# Patient Record
Sex: Female | Born: 1980 | Race: White | Hispanic: No | Marital: Single | Smoking: Never smoker
Health system: Southern US, Community
[De-identification: ages and names within clinical notes are randomized; demographics above are authoritative.]

## PROBLEM LIST (undated history)

## (undated) DIAGNOSIS — K602 Anal fissure, unspecified: Secondary | ICD-10-CM

## (undated) DIAGNOSIS — E079 Disorder of thyroid, unspecified: Secondary | ICD-10-CM

## (undated) DIAGNOSIS — J302 Other seasonal allergic rhinitis: Secondary | ICD-10-CM

## (undated) HISTORY — PX: CHOLECYSTECTOMY: SHX55

---

## 2019-05-28 ENCOUNTER — Emergency Department (HOSPITAL_COMMUNITY): Payer: Self-pay

## 2019-05-28 ENCOUNTER — Other Ambulatory Visit: Payer: Self-pay

## 2019-05-28 ENCOUNTER — Emergency Department (HOSPITAL_COMMUNITY)
Admission: EM | Admit: 2019-05-28 | Discharge: 2019-05-28 | Disposition: A | Discharge status: Home / Self Care | Payer: Self-pay | Attending: Emergency Medicine | Admitting: Emergency Medicine

## 2019-05-28 DIAGNOSIS — D259 Leiomyoma of uterus, unspecified: Secondary | ICD-10-CM | POA: Insufficient documentation

## 2019-05-28 DIAGNOSIS — N281 Cyst of kidney, acquired: Secondary | ICD-10-CM | POA: Insufficient documentation

## 2019-05-28 DIAGNOSIS — K529 Noninfective gastroenteritis and colitis, unspecified: Secondary | ICD-10-CM | POA: Insufficient documentation

## 2019-05-28 DIAGNOSIS — R1032 Left lower quadrant pain: Secondary | ICD-10-CM

## 2019-05-28 DIAGNOSIS — Q631 Lobulated, fused and horseshoe kidney: Secondary | ICD-10-CM | POA: Insufficient documentation

## 2019-05-28 DIAGNOSIS — Z79899 Other long term (current) drug therapy: Secondary | ICD-10-CM | POA: Insufficient documentation

## 2019-05-28 LAB — URINALYSIS, ROUTINE W REFLEX MICROSCOPIC
Bilirubin Urine: NEGATIVE
Glucose, UA: NEGATIVE mg/dL
Hgb urine dipstick: NEGATIVE
Ketones, ur: NEGATIVE mg/dL
Leukocytes,Ua: NEGATIVE
Nitrite: NEGATIVE
Protein, ur: NEGATIVE mg/dL
Specific Gravity, Urine: 1.016 (ref 1.005–1.030)
pH: 6 (ref 5.0–8.0)

## 2019-05-28 LAB — CBC
HCT: 39 % (ref 36.0–46.0)
Hemoglobin: 12 g/dL (ref 12.0–15.0)
MCH: 26.8 pg (ref 26.0–34.0)
MCHC: 30.8 g/dL (ref 30.0–36.0)
MCV: 87.2 fL (ref 80.0–100.0)
Platelets: 347 10*3/uL (ref 150–400)
RBC: 4.47 MIL/uL (ref 3.87–5.11)
RDW: 15.6 % — ABNORMAL HIGH (ref 11.5–15.5)
WBC: 8.1 10*3/uL (ref 4.0–10.5)
nRBC: 0 % (ref 0.0–0.2)

## 2019-05-28 LAB — COMPREHENSIVE METABOLIC PANEL
ALT: 27 U/L (ref 0–44)
AST: 24 U/L (ref 15–41)
Albumin: 3.5 g/dL (ref 3.5–5.0)
Alkaline Phosphatase: 83 U/L (ref 38–126)
Anion gap: 9 (ref 5–15)
BUN: 13 mg/dL (ref 6–20)
CO2: 22 mmol/L (ref 22–32)
Calcium: 8.2 mg/dL — ABNORMAL LOW (ref 8.9–10.3)
Chloride: 105 mmol/L (ref 98–111)
Creatinine, Ser: 0.87 mg/dL (ref 0.44–1.00)
GFR calc Af Amer: >60 mL/min (ref >60)
GFR calc non Af Amer: >60 mL/min (ref >60)
Glucose, Bld: 101 mg/dL — ABNORMAL HIGH (ref 70–99)
Potassium: 3.6 mmol/L (ref 3.5–5.1)
Sodium: 136 mmol/L (ref 135–145)
Total Bilirubin: 0.5 mg/dL (ref 0.3–1.2)
Total Protein: 6.7 g/dL (ref 6.5–8.1)

## 2019-05-28 LAB — I-STAT BETA HCG BLOOD, ED (MC, WL, AP ONLY): I-stat hCG, quantitative: <5 m[IU]/mL (ref <5)

## 2019-05-28 LAB — LIPASE, BLOOD: Lipase: 20 U/L (ref 11–51)

## 2019-05-28 MED ORDER — ACETAMINOPHEN 325 MG PO TABS
650.0000 mg | ORAL_TABLET | Freq: Once | ORAL | Status: AC
  Administered 2019-05-28: 11:00:00 650 mg via ORAL
  Filled 2019-05-28: qty 2

## 2019-05-28 MED ORDER — ONDANSETRON HCL 4 MG/2ML IJ SOLN
4.0000 mg | Freq: Once | INTRAMUSCULAR | Status: DC
  Filled 2019-05-28: qty 2

## 2019-05-28 MED ORDER — ONDANSETRON 4 MG PO TBDP
4.0000 mg | ORAL_TABLET | Freq: Once | ORAL | Status: AC
  Administered 2019-05-28: 4 mg via ORAL
  Filled 2019-05-28: qty 1

## 2019-05-28 MED ORDER — HYDROCODONE-ACETAMINOPHEN 5-325 MG PO TABS: 1.0000 | ORAL_TABLET | Freq: Four times a day (QID) | ORAL | 0 refills | Status: DC | PRN

## 2019-05-28 NOTE — Discharge Instructions (Addendum)
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  At this time there does not appear to be the presence of an emergent medical condition, however there is always the potential for conditions to change. Please read and follow the below instructions.  Please return to the Emergency Department immediately for any new or worsening symptoms. Please be sure to follow up with your Primary Care Provider within one week regarding your visit today; please call their office to schedule an appointment even if you are feeling better for a follow-up visit. CT scan today showed inflammation of your mid abdominal small bowel.  You have been referred to [facility] gastroenterology today. Please call their office today to schedule a follow-up appointment for further evaluation of this inflammation as investigation with further lab work and possible colonoscopy/endoscopy may be indicated.  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  Please discuss these findings with your primary care provider at your next visit. Additionally a uterine fibroid was seen on your ultrasound today, I discussed this with your primary care provider and your OB/GYN at your next visit. Please drink plenty of water and get plenty of rest to help with your symptoms.  You may use the pain medication Norco as prescribed for severe pain.  Do not use Norco with other sedating medications such as Valium as this will worsen side effects.  Do not drink alcohol with Norco.  Do not drive or perform any dangerous activities while taking Norco as it will make you drowsy.  Get help right away if: Your pain does not go away as soon as your doctor says it should. You cannot stop throwing up. Your pain is only in areas of your belly, such as the right side or the left lower part of the belly. You have bloody or black poop, or poop that looks like tar. You have  very bad pain, cramping, or bloating in your belly. You have signs of not having enough fluid or water in your body (dehydration), such as: Dark pee, very little pee, or no pee. Cracked lips. Dry mouth. Sunken eyes. Sleepiness. Weakness. You have any new/concerning or worsening of symptoms  Please read the additional information packets attached to your discharge summary.  Do not take your medicine if  develop an itchy rash, swelling in your mouth or lips, or difficulty breathing; call 911 and seek immediate emergency medical attention if this occurs.  Note: Portions of this text may have been transcribed using voice recognition software. Every effort was made to ensure accuracy; however, inadvertent computerized transcription errors may still be present.

## 2019-05-28 NOTE — ED Notes (Signed)
Patient verbalizes understanding of discharge instructions. Opportunity for questioning and answers were provided. Armband removed by staff, pt discharged from ED to home 

## 2019-05-28 NOTE — ED Provider Notes (Signed)
[place] EMERGENCY DEPARTMENT Provider Note   CSN: [id number] Arrival date & time: 05/28/19  [id number]     History Chief Complaint  Patient presents with   Abdominal Pain    Sharon Pruitt is a 38 y.o. female presents today for abdominal pain and multiple bowel movements.  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  She reports that last night she developed a left lower abdominal cramping pain which has been waxing and waning since that time and moderate-severe in intensity and radiating to her mid abdomen, she reports this pain was accompanied with approximately 10 bowel movements in the last 12 hours she denies any diarrhea reports each of her bowel movements were solid in consistency.  Patient became concerned when her pain did not stop after she had multiple bowel movements as is her normal.  Patient reports some nausea with pain.  Denies fever/chills, headache, neck pain, chest pain/shortness of breath, cough/hemoptysis, emesis, diarrhea, melena, hematochezia, dysuria/hematuria, vaginal bleeding/discharge, concern for STI, numbness/weakness, tingling or any additional concerns. HPI     No past medical history on file.  There are no problems to display for this patient.      OB History   No obstetric history on file.     No family history on file.  Social History   Tobacco Use   Smoking status: Not on file  Substance Use Topics   Alcohol use: Not on file   Drug use: Not on file    Home Medications Prior to Admission medications   Medication Sig Start Date End Date Taking? Authorizing Provider  diazepam (VALIUM) 5 MG tablet Take 5 mg by mouth daily as needed for anxiety.   Yes [provider]  omeprazole (PRILOSEC) 20 MG capsule Take 20 mg by mouth daily. 09/01/18  Yes [provider]  ondansetron (ZOFRAN-ODT) 4 MG disintegrating tablet Take 4 mg by mouth  every 12 (twelve) hours as needed for nausea. 09/01/18  Yes [provider]  HYDROcodone-acetaminophen (NORCO/VICODIN) 5-325 MG tablet Take 1 tablet by mouth every 6 (six) hours as needed. 05/28/19   Deliah Boston, PA-C  levothyroxine (SYNTHROID) 25 MCG tablet Take 25 mcg by mouth daily. 04/02/19   [provider]    Allergies    Contrast media [iodinated diagnostic agents], Penicillins, and Lactase  Review of Systems   Review of Systems Ten systems are reviewed and are negative for acute change except as noted in the HPI  Physical Exam Updated Vital Signs BP 105/79 (BP Location: Right Arm)    Pulse 78    Temp 98.3 F (36.8 C) (Oral)    Resp 20    LMP 05/02/2019    SpO2 97%   Physical Exam Constitutional:      General: She is not in acute distress.    Appearance: Normal appearance. She is well-developed. She is obese. She is not ill-appearing or diaphoretic.  HENT:     Head: Normocephalic and atraumatic.     Right Ear: External ear normal.     Left Ear: External ear normal.     Nose: Nose normal.  Eyes:     General: Vision grossly intact. Gaze aligned appropriately.     Pupils: Pupils are equal, round, and reactive to light.  Neck:     Trachea: Trachea and phonation normal. No tracheal deviation.  Pulmonary:     Effort: Pulmonary effort is normal. No respiratory distress.  Abdominal:     General: There is no distension.     Palpations: Abdomen is soft.     Tenderness: There is generalized abdominal tenderness. There is no guarding or rebound.  Genitourinary:    Comments: Pelvic exam deferred by patient Musculoskeletal:        General: Normal range of motion.     Cervical back: Normal range of motion.  Skin:    General: Skin is warm and dry.  Neurological:     Mental Status: She is alert.     GCS: GCS eye subscore is 4. GCS verbal subscore is 5. GCS motor subscore is 6.     Comments: Speech is clear and goal oriented, follows commands Major Cranial  nerves without deficit, no facial droop Moves extremities without ataxia, coordination intact  Psychiatric:        Behavior: Behavior normal.     ED Results / Procedures / Treatments   Labs (all labs ordered are listed, but only abnormal results are displayed) Labs Reviewed  COMPREHENSIVE METABOLIC PANEL - Abnormal; Notable for the following components:      Result Value   Glucose, Bld 101 (*)    Calcium 8.2 (*)    All other components within normal limits  CBC - Abnormal; Notable for the following components:   RDW 15.6 (*)    All other components within normal limits  URINALYSIS, ROUTINE W REFLEX MICROSCOPIC - Abnormal; Notable for the following components:   APPearance HAZY (*)    All other components within normal limits  LIPASE, BLOOD  I-STAT BETA HCG BLOOD, ED ([facility], [facility], [facility] ONLY)    EKG None  Radiology CT ABDOMEN PELVIS WO CONTRAST  Result Date: 05/28/2019 CLINICAL DATA:  38 year old female with left side back and abdominal pain. Frequent bowel movements. Possible diverticulitis. EXAM: CT ABDOMEN AND PELVIS WITHOUT CONTRAST TECHNIQUE: Multidetector CT imaging of the abdomen and pelvis was performed following the standard protocol without IV contrast. COMPARISON:  None. FINDINGS: Lower chest: Negative. Hepatobiliary: Surgically absent gallbladder. Negative noncontrast liver. Pancreas: Negative. Spleen: Negative. Adrenals/Urinary Tract: Normal adrenal glands. 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 There are at least 2 right side renal arteries identified. But in the left renal fossa there is a multi cystic lobulated lesion which is connected by a thin band of tissue to the right side renal parenchyma. See coronal images 52 through 62. Multiple small cystic components to almost 3 centimeters range from subcentimeter and have simple fluid density. There is no associated perinephric  stranding or urinary calculus. A right side ureter takes off anteriorly from the renal complex and appears decompressed to the bladder. It is unclear whether there might be a vestigial left side ureter. The urinary bladder is decompressed and unremarkable. Stomach/Bowel: Negative rectum aside from retained stool. Redundant sigmoid colon, but no diverticulosis. Negative descending colon and transverse colon. Mildly redundant hepatic flexure. Negative right colon, with no large bowel inflammation identified. Decompressed and negative terminal ileum. Normal appendix visible on coronal image 45. There is an abnormally dilated loop of small bowel in the mid abdomen containing flocculated stool type material on series 3, image 52 and coronal images 24 through 30. This loop measures about 40 millimeters diameter with regional smaller but similar featureless appearing small bowel loops. There is mesenteric stranding associated with the dominant loop (coronal image 28), but wall thickening without mesenteric stranding elsewhere (coronal image 43). There is a  gradual transition both upstream and downstream of these abnormal loops which seem to be distal jejunum. The distal small bowel appears negative. Decompressed and negative stomach, duodenum, and proximal jejunum. No free air or free fluid identified. Vascular/Lymphatic: Vascular patency is not evaluated in the absence of IV contrast. No calcified atherosclerosis. Reproductive: Negative noncontrast appearance. Other: Trace free fluid in the cul-de-sac. There do appear to be chronic postoperative changes to the ventral lower abdominal wall, including somewhat stellate granulation tissue suspected on series 3, image 72. But no regional inflammation. Musculoskeletal: No acute osseous abnormality identified. 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 IMPRESSION: 1. The symptomatic abnormality is favored to be abnormal mid abdominal small  bowel which appears mildly inflamed, dilated up to 4 cm, and contains flocculated material. There is a gradual transition from abnormal mid small bowel loops to normal proximal and distal small bowel. And no other bowel inflammation identified. Differential considerations include acute infectious enteritis, focal inflammatory bowel disease (although the terminal ileum is normal), and internal hernia (see #3). 2. Normal appendix. Negative large bowel. No free air or free fluid. 3. Unusual renal malformation which could reflect cross-fused right side renal ectopia and/or atrophy of the left renal component of a horseshoe kidney with compensatory hypertrophy on the right. Small multiloculated benign-appearing cystic lesions occupy the left renal fossa and appear connected by a thin band of tissue to the right side renal complex. No urinary calculus, obstructive uropathy, or inflammation is evident. 4. Trace pelvic free fluid, likely physiologic. Electronically Signed   By: Genevie Ann M.D.   On: 05/28/2019 07:59   US PELVIC COMPLETE W TRANSVAGINAL AND TORSION R/O  Result Date: 05/28/2019 CLINICAL DATA:  Intermittent left lower quadrant pain with recent worsening EXAM: TRANSABDOMINAL AND TRANSVAGINAL ULTRASOUND OF PELVIS DOPPLER ULTRASOUND OF OVARIES TECHNIQUE: Both transabdominal and transvaginal ultrasound examinations of the pelvis were performed. Transabdominal technique was performed for global imaging of the pelvis including uterus, ovaries, adnexal regions, and pelvic cul-de-sac. It was necessary to proceed with endovaginal exam following the transabdominal exam to visualize the uterus, endometrium and right ovary. Color and duplex Doppler ultrasound was utilized to evaluate blood flow to the ovaries. COMPARISON:  None. FINDINGS: Uterus Measurements: 9.8 x 5 x 6.5 cm = volume: 168.5 mL. There is a 4.1 x 3.6 x 4.9 cm fibroid at the anterior fundus. Endometrium Thickness: 10 mm.  No focal abnormality visualized.  Right ovary Measurements: 3 x 2.1 x 1.8 cm = volume: 5.8 mL. Normal appearance/no adnexal mass. Left ovary Measurements: Size in 3 dimensions = volume: 6.8 mL. Normal appearance/no adnexal mass. Pulsed Doppler evaluation of both ovaries demonstrates normal low-resistance arterial and venous waveforms. Other findings Trace free fluid, likely physiologic. IMPRESSION: No evidence of ovarian torsion. Uterine fibroid. Electronically Signed   By: Macy Mis M.D.   On: 05/28/2019 11:04    Procedures Procedures (including critical care time)  Medications Ordered in ED Medications  ondansetron (ZOFRAN-ODT) disintegrating tablet 4 mg (4 mg Oral Given 05/28/19 0806)  acetaminophen (TYLENOL) tablet 650 mg (650 mg Oral Given 05/28/19 1043)    ED Course  I have reviewed the triage vital signs and the nursing notes.  Pertinent labs & imaging results that were available during my care of the patient were reviewed by me and considered in my medical decision making (see chart for details).    MDM Rules/Calculators/A&P  Beta-hCG negative CBC nonacute  CMP nonacute Lipase within normal limits Urinalysis nonacute - On initial evaluation patient well-appearing and in no acute distress, cranial nerves intact, no meningeal signs, heart regular rate and rhythm, lungs clear, abdomen generally tender without guarding or rebound, neurovascular tact to all 4 extremities without evidence of DVT.  She has asked not to receive any pain medication here in the ED.  There is concern for possible diverticulitis will obtain CT scan, patient has contrast allergy will have to obtain scan noncontrasted.  Additionally low suspicion for GYN etiology of patient's symptoms today, she denies any pelvic pain, vaginal bleeding or discharge, she denies concern for STI and refuses pelvic exam. - CT [facility]:  IMPRESSION:  1. The symptomatic abnormality is favored to be abnormal mid  abdominal small bowel which  appears mildly inflamed, dilated up to 4  cm, and contains flocculated material.  There is a gradual transition from abnormal mid small bowel loops to  normal proximal and distal small bowel. And no other bowel  inflammation identified.  Differential considerations include acute infectious enteritis,  focal inflammatory bowel disease (although the terminal ileum is  normal), and internal hernia (see #3).    2. Normal appendix. Negative large bowel. No free air or free fluid.    3. Unusual renal malformation which could reflect cross-fused right  side renal ectopia and/or atrophy of the left renal component of a  horseshoe kidney with compensatory hypertrophy on the right.  Small multiloculated benign-appearing cystic lesions occupy the left  renal fossa and appear connected by a thin band of tissue to the  right side renal complex.  No urinary calculus, obstructive uropathy, or inflammation is  evident.    4. Trace pelvic free fluid, likely physiologic.     - 8:35 AM: Patient updated on findings as above and states understanding.  She is requesting GI referral here in [place] which will be provided at discharge.  I have discussed pelvic exam with the patient, she refuses pelvic examination today but requests ultrasound be performed for evaluation of her ovary.  She denies concern for STI stating she is in a stable relationship, feel it is reasonable at this time to proceed with ultrasound examination for evaluation of possible cyst versus torsion as etiology of her left lower quadrant pain that is not completely explained with mid small bowel inflammation. - Pelvis US:  IMPRESSION:  No evidence of ovarian torsion.    Uterine fibroid.    - Patient updated on findings as above and states understanding.  She has been given GI referral for further evaluation of bowel inflammation.  As she is without infectious-like symptoms, afebrile and normal vital signs I have low suspicion  for this to be infectious in etiology, no indictation at this time for antibiotic treatment.  Patient plans to call GI today to schedule a follow-up appointment for further evaluation.  Additionally patient's left lower quadrant pain may be referred from the inflammation of her os, there is no evidence of torsion on ultrasound, she refused pelvic examination twice today feel this is reasonable as she is without vaginal discharge and has no concern for STI, I have low suspicion for PID at this time.  I encouraged her to call her primary care doctor and OB/GYN to schedule follow-up appointment and pelvic examination.  PMDP reviewed patient's last narcotic prescription was 01/27/2019 for 15 pills of Norco.  Feel it is reasonable at this time to provide patient with a prescription for 3  pills of Norco at this time for pain due to her bowel inflammation, I discussed narcotic precautions with patient and she states understanding, additionally advised her not to take Valium on the same days that she takes Norco to avoid sedation.  At this time there does not appear to be any evidence of an acute emergency medical condition and the patient appears stable for discharge with appropriate outpatient follow up. Diagnosis was discussed with patient who verbalizes understanding of care plan and is agreeable to discharge. I have discussed return precautions with patient who verbalizes understanding of return precautions. Patient encouraged to follow-up with their PCP and GI. All questions answered.  Patient's case discussed with Dr. Theora Gianotti who agrees with plan to discharge with GI follow-up.   Note: Portions of this report may have been transcribed using voice recognition software. Every effort was made to ensure accuracy; however, inadvertent computerized transcription errors may still be present. Final Clinical Impression(s) / ED Diagnoses Final diagnoses:  Bowel disease, inflammatory  Kidney cysts  Horseshoe kidney    Uterine leiomyoma, unspecified location    Rx / DC Orders ED Discharge Orders         Ordered    HYDROcodone-acetaminophen (NORCO/VICODIN) 5-325 MG tablet  Every 6 hours PRN     05/28/19 1144           Gari Crown 05/28/19 1158    Blanchie Dessert, MD 05/29/19 2346

## 2019-05-28 NOTE — ED Notes (Signed)
Patient transported to CT 

## 2019-05-28 NOTE — ED Triage Notes (Signed)
Pt states abd pain with frequent bowel movements since yesterday. Denies blood in bowel movements.

## 2020-01-30 ENCOUNTER — Encounter (HOSPITAL_COMMUNITY): Payer: Self-pay | Admitting: Emergency Medicine

## 2020-01-30 ENCOUNTER — Emergency Department (HOSPITAL_COMMUNITY)
Admission: EM | Admit: 2020-01-30 | Discharge: 2020-01-31 | Disposition: A | Discharge status: Home / Self Care | Payer: Self-pay | Attending: Emergency Medicine | Admitting: Emergency Medicine

## 2020-01-30 DIAGNOSIS — Z5321 Procedure and treatment not carried out due to patient leaving prior to being seen by health care provider: Secondary | ICD-10-CM | POA: Insufficient documentation

## 2020-01-30 DIAGNOSIS — K625 Hemorrhage of anus and rectum: Secondary | ICD-10-CM | POA: Insufficient documentation

## 2020-01-30 NOTE — ED Triage Notes (Signed)
Patient reports she noticed blood while wiping earlier this evening. Also reports rectal discomfort with sitting. Reports she may have hemorrhoids.

## 2020-01-31 NOTE — ED Notes (Signed)
Patient called x2 with no response for vitals recheck

## 2020-02-20 ENCOUNTER — Other Ambulatory Visit: Payer: Self-pay

## 2020-02-20 ENCOUNTER — Emergency Department (HOSPITAL_COMMUNITY)
Admission: EM | Admit: 2020-02-20 | Discharge: 2020-02-20 | Disposition: A | Discharge status: Home / Self Care | Payer: BC Managed Care – PPO | Attending: Emergency Medicine | Admitting: Emergency Medicine

## 2020-02-20 ENCOUNTER — Ambulatory Visit (HOSPITAL_COMMUNITY)
Admission: EM | Admit: 2020-02-20 | Discharge: 2020-02-20 | Disposition: A | Discharge status: Home / Self Care | Payer: BC Managed Care – PPO | Attending: Family Medicine | Admitting: Family Medicine

## 2020-02-20 ENCOUNTER — Encounter (HOSPITAL_COMMUNITY): Payer: Self-pay | Admitting: Emergency Medicine

## 2020-02-20 ENCOUNTER — Encounter (HOSPITAL_COMMUNITY): Payer: Self-pay

## 2020-02-20 DIAGNOSIS — R110 Nausea: Secondary | ICD-10-CM | POA: Diagnosis not present

## 2020-02-20 DIAGNOSIS — K625 Hemorrhage of anus and rectum: Secondary | ICD-10-CM | POA: Diagnosis present

## 2020-02-20 DIAGNOSIS — M5442 Lumbago with sciatica, left side: Secondary | ICD-10-CM

## 2020-02-20 DIAGNOSIS — L0231 Cutaneous abscess of buttock: Secondary | ICD-10-CM | POA: Diagnosis not present

## 2020-02-20 DIAGNOSIS — R519 Headache, unspecified: Secondary | ICD-10-CM | POA: Insufficient documentation

## 2020-02-20 DIAGNOSIS — Z5321 Procedure and treatment not carried out due to patient leaving prior to being seen by health care provider: Secondary | ICD-10-CM | POA: Insufficient documentation

## 2020-02-20 LAB — CBC
HCT: 36 % (ref 36.0–46.0)
Hemoglobin: 11.6 g/dL — ABNORMAL LOW (ref 12.0–15.0)
MCH: 29.6 pg (ref 26.0–34.0)
MCHC: 32.2 g/dL (ref 30.0–36.0)
MCV: 91.8 fL (ref 80.0–100.0)
Platelets: 373 10*3/uL (ref 150–400)
RBC: 3.92 MIL/uL (ref 3.87–5.11)
RDW: 13.7 % (ref 11.5–15.5)
WBC: 8 10*3/uL (ref 4.0–10.5)
nRBC: 0 % (ref 0.0–0.2)

## 2020-02-20 LAB — COMPREHENSIVE METABOLIC PANEL
ALT: 30 U/L (ref 0–44)
AST: 22 U/L (ref 15–41)
Albumin: 3.2 g/dL — ABNORMAL LOW (ref 3.5–5.0)
Alkaline Phosphatase: 90 U/L (ref 38–126)
Anion gap: 10 (ref 5–15)
BUN: 9 mg/dL (ref 6–20)
CO2: 22 mmol/L (ref 22–32)
Calcium: 8.5 mg/dL — ABNORMAL LOW (ref 8.9–10.3)
Chloride: 106 mmol/L (ref 98–111)
Creatinine, Ser: 1.02 mg/dL — ABNORMAL HIGH (ref 0.44–1.00)
GFR calc Af Amer: >60 mL/min (ref >60)
GFR calc non Af Amer: >60 mL/min (ref >60)
Glucose, Bld: 117 mg/dL — ABNORMAL HIGH (ref 70–99)
Potassium: 3.5 mmol/L (ref 3.5–5.1)
Sodium: 138 mmol/L (ref 135–145)
Total Bilirubin: <0.1 mg/dL — ABNORMAL LOW (ref 0.3–1.2)
Total Protein: 6.7 g/dL (ref 6.5–8.1)

## 2020-02-20 LAB — TYPE AND SCREEN
ABO/RH(D): O POS
Antibody Screen: NEGATIVE

## 2020-02-20 LAB — I-STAT BETA HCG BLOOD, ED (MC, WL, AP ONLY): I-stat hCG, quantitative: <5 m[IU]/mL (ref <5)

## 2020-02-20 MED ORDER — METRONIDAZOLE 0.75 % EX GEL: 1.0000 "application " | Freq: Two times a day (BID) | CUTANEOUS | 0 refills | Status: AC

## 2020-02-20 MED ORDER — CYCLOBENZAPRINE HCL 5 MG PO TABS: 5.0000 mg | ORAL_TABLET | Freq: Three times a day (TID) | ORAL | 0 refills | Status: DC | PRN

## 2020-02-20 NOTE — ED Provider Notes (Signed)
[facility]    CSN: [id number] Arrival date & time: 02/20/20  1403      History   Chief Complaint Chief Complaint  Patient presents with   Rectal Bleeding    HPI Sharon Pruitt is a 39 y.o. female.   Started having rectal bleeding and left low back and buttock pain the past few days after lifting something very heavy at work. 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 Has been having rectal pain, discharge and loose stools recently which started during prednisone and 2 zpaks. PCP sent her to GI for suspected internal hemorrhoids, has that appt on Wednesday. Denies fever, chills, body aches, abdominal pain, N/V. Has been doing sitz baths and using witch hazel wipes without much relief. Tylenol does seem to help the pain some.      History reviewed. No pertinent past medical history.  There are no problems to display for this patient.   History reviewed. No pertinent surgical history.  OB History   No obstetric history on file.      Home Medications    Prior to Admission medications   Medication Sig Start Date End Date Taking? Authorizing Provider  cyclobenzaprine (FLEXERIL) 5 MG tablet Take 1 tablet (5 mg total) by mouth 3 (three) times daily as needed for muscle spasms. DO NOT DRIVE OR DRINK ALCOHOL WHILE TAKING THIS MEDICATION 02/20/20   Volney American, PA-C  diazepam (VALIUM) 5 MG tablet Take 5 mg by mouth daily as needed for anxiety.    [provider]  HYDROcodone-acetaminophen (NORCO/VICODIN) 5-325 MG tablet Take 1 tablet by mouth every 6 (six) hours as needed. 05/28/19   Deliah Boston, PA-C  levothyroxine (SYNTHROID) 25 MCG tablet Take 25 mcg by mouth daily. 04/02/19   [provider]  metroNIDAZOLE (METROGEL) 0.75 % gel Apply 1 application topically 2 (two) times daily. 02/20/20   Volney American, PA-C  omeprazole (PRILOSEC) 20 MG capsule Take 20 mg by mouth daily. 09/01/18   [provider]  ondansetron (ZOFRAN-ODT) 4 MG disintegrating tablet Take 4 mg by mouth every 12 (twelve) hours as needed for nausea. 09/01/18   [provider]    Family History History reviewed. No pertinent family history.  Social History Social History   Tobacco Use   Smoking status: Never Smoker   Smokeless tobacco: Never Used  Substance Use Topics   Alcohol use: Yes    Comment: Socially    Drug use: Not Currently     Allergies   Iodinated diagnostic agents, Penicillins, Sulfa antibiotics, and Lactase   Review of Systems Review of Systems PER HPI   Physical Exam Triage Vital Signs ED Triage Vitals  Enc Vitals Group     BP 02/20/20 1552 (!) 107/59     Pulse Rate 02/20/20 1552 (!) 109     Resp 02/20/20 1552 20     Temp 02/20/20 1552 98.3 F (36.8 C)     Temp Source 02/20/20 1552 Oral     SpO2 02/20/20 1552 100 %     Weight --      Height --      Head Circumference --      Peak Flow --      Pain Score 02/20/20 1550 0     Pain Loc --      Pain Edu? --      Excl. in [place]? --    No data found.  Updated Vital  Signs BP (!) 107/59 (BP Location: Right Arm)    Pulse (!) 109    Temp 98.3 F (36.8 C) (Oral)    Resp 20    LMP 02/13/2020    SpO2 100%   Visual Acuity Right Eye Distance:   Left Eye Distance:   Bilateral Distance:    Right Eye Near:   Left Eye Near:    Bilateral Near:     Physical Exam Vitals and nursing note reviewed.  Constitutional:      Appearance: Normal appearance. She is not ill-appearing.  HENT:     Head: Atraumatic.  Eyes:     Extraocular Movements: Extraocular movements intact.     Conjunctiva/sclera: Conjunctivae normal.  Cardiovascular:     Rate and Rhythm: Normal rate and regular rhythm.     Pulses: Normal pulses.     Heart sounds: Normal heart sounds.  Pulmonary:     Effort: Pulmonary effort is normal.     Breath sounds: Normal breath sounds. No wheezing or rales.  Abdominal:     General: Bowel sounds are normal. There is  no distension.     Palpations: Abdomen is soft.     Tenderness: There is no abdominal tenderness. There is no guarding.  Genitourinary:   Musculoskeletal:        General: Tenderness (ttp over left low back down into lateral hip) present. Normal range of motion.     Cervical back: Normal range of motion and neck supple.     Comments: - SLR b/l LEs  Skin:    General: Skin is warm and dry.  Neurological:     Mental Status: She is alert and oriented to person, place, and time.     Sensory: No sensory deficit.  Psychiatric:        Mood and Affect: Mood normal.        Thought Content: Thought content normal.        Judgment: Judgment normal.      UC Treatments / Results  Labs (all labs ordered are listed, but only abnormal results are displayed) Labs Reviewed - No data to display  EKG   Radiology No results found.  Procedures Procedures (including critical care time)  Medications Ordered in UC Medications - No data to display  Initial Impression / Assessment and Plan / UC Course  I have reviewed the triage vital signs and the nursing notes.  Pertinent labs & imaging results that were available during my care of the patient were reviewed by me and considered in my medical decision making (see chart for details).     Patient states she just received a large bottle of doxycycline from her PCP for an ear infection but hadn't started it yet - discussed to begin taking to help with her abscess and will also rx metrogel for increased coverage given location. She declines I and D of the abscess today. Discussed warm sitz baths, close f/u with GI specialist for this and her suspected internal hemorrhoids on Wednesday.  Rx also sent for flexeril for suspected piriformis syndrome on the left. Light duty note for work with lifting restrictions given as I feel this is exacerbating her hemorrhoids as well as muscular pain.  Pt agreeable to plan and instructed to f/u if sxs worsening at  any time. F/u with specialists and PCP as scheduled  Final Clinical Impressions(s) / UC Diagnoses   Final diagnoses:  Rectal bleeding  Gluteal abscess  Acute left-sided low back pain with left-sided sciatica  Discharge Instructions     Take 1 week of twice daily doxycycline and apply the metronidazole gel topically to the area of the abscess twice daily. Warm sitz baths, rest, avoid heavy lifting    ED Prescriptions    Medication Sig Dispense Auth. Provider   metroNIDAZOLE (METROGEL) 0.75 % gel Apply 1 application topically 2 (two) times daily. 45 g Volney American, [place]   cyclobenzaprine (FLEXERIL) 5 MG tablet Take 1 tablet (5 mg total) by mouth 3 (three) times daily as needed for muscle spasms. DO NOT DRIVE OR DRINK ALCOHOL WHILE TAKING THIS MEDICATION 30 tablet Volney American, [place]     PDMP not reviewed this encounter.   Volney American, [place] 02/20/20 1948

## 2020-02-20 NOTE — ED Triage Notes (Signed)
Pt reports bright red rectal bleeding x 3-4 days that started after picking her son up.  States blood doesn't appear to be in her stool.  Pain to L buttocks.  Also reports headache and nausea.

## 2020-02-20 NOTE — Discharge Instructions (Signed)
Take 1 week of twice daily doxycycline and apply the metronidazole gel topically to the area of the abscess twice daily. Warm sitz baths, rest, avoid heavy lifting

## 2020-02-20 NOTE — ED Triage Notes (Signed)
Pt presents with rectal pain and let buttock tingling x 3-4 days. Pt reports having bright red rectal bleeding.

## 2020-08-06 IMAGING — CT CT ABD-PELV W/O CM
2 of 4 series · 14 of 46 positions shown, 16 images · non-contrast
Comparison: None.

CLINICAL DATA: 38-year-old female with left side back and abdominal
pain. Frequent bowel movements. Possible diverticulitis.

EXAM:
CT ABDOMEN AND PELVIS WITHOUT CONTRAST
TECHNIQUE: Multidetector CT imaging of the abdomen and pelvis was performed
following the standard protocol without IV contrast.

[Series 3: ap without · axial · non-contrast · 0.85mm/px · z∈[+892,+1307]mm · 11 of 95 slices shown, 13 images]
[im 6/95  soft-tissue]
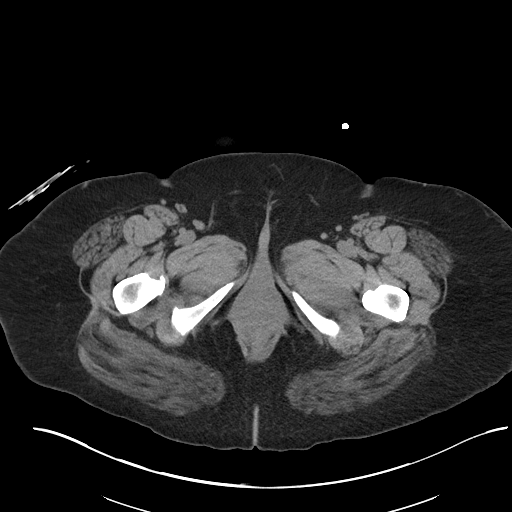
[im 6/95  bone]
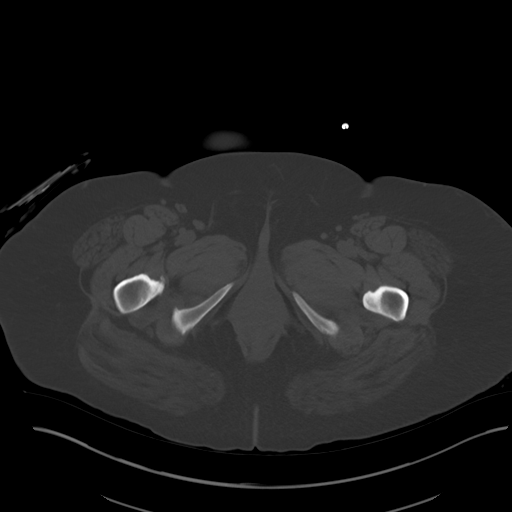
[im 16/95  soft-tissue]
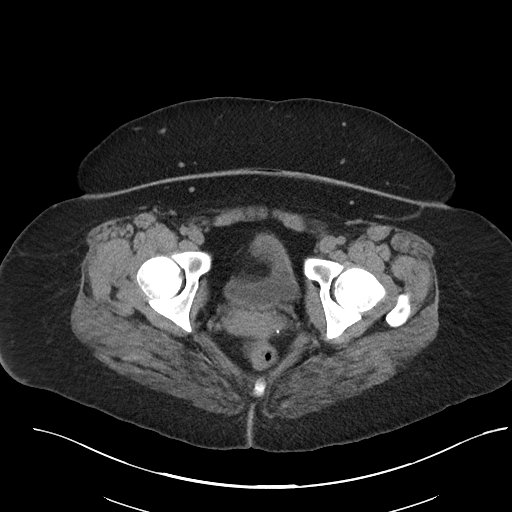
[im 21/95  soft-tissue]
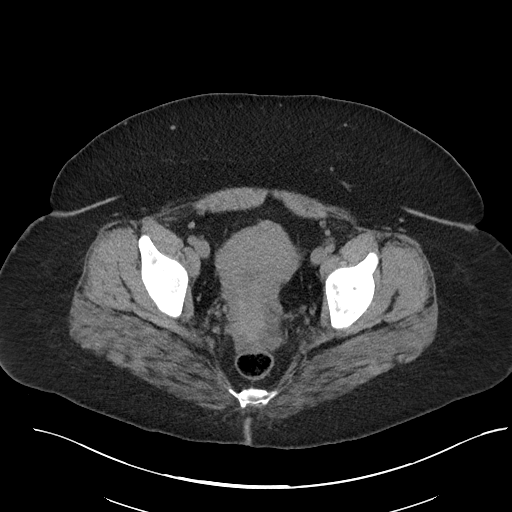
[im 32/95  soft-tissue]
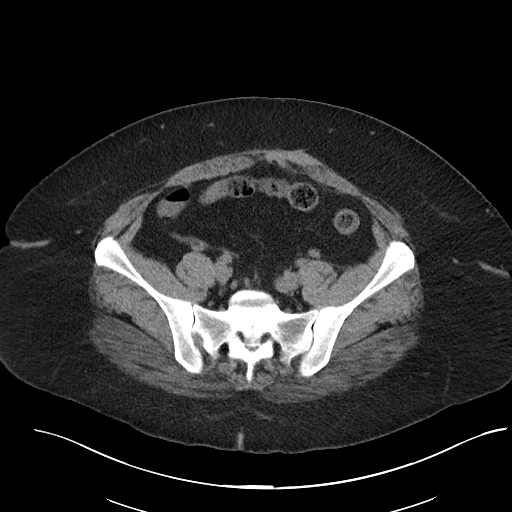
[im 37/95  soft-tissue]
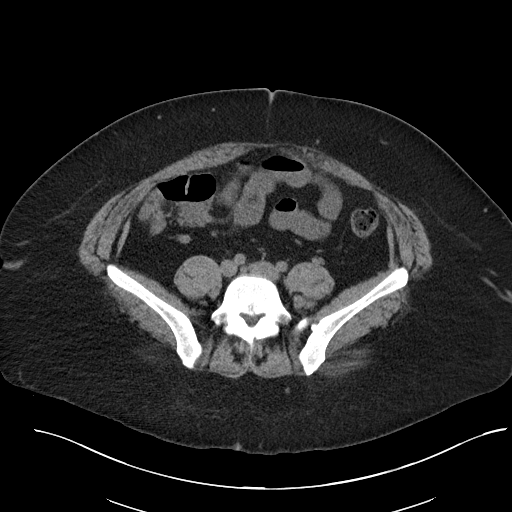
[im 48/95  soft-tissue]
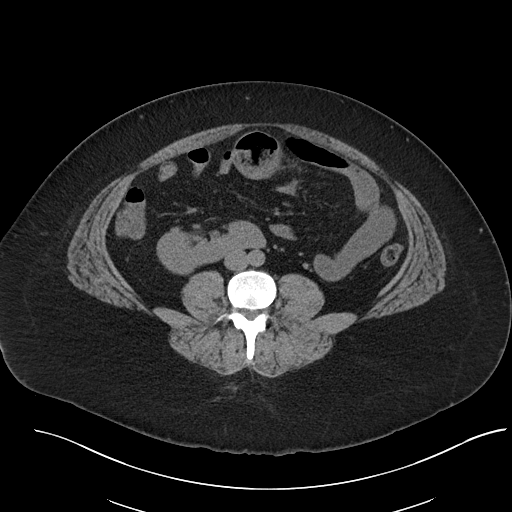
[im 58/95  soft-tissue]
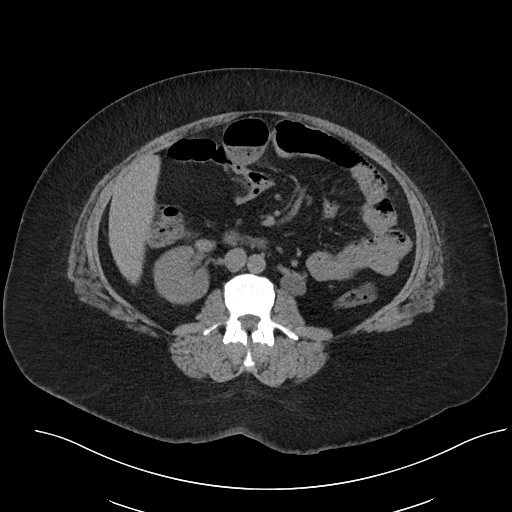
[im 63/95  soft-tissue]
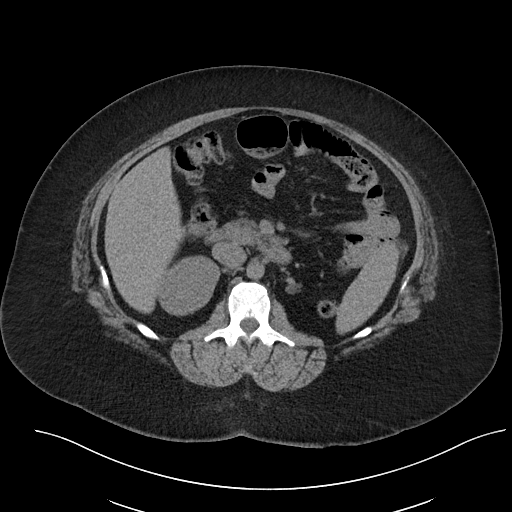
[im 74/95  soft-tissue]
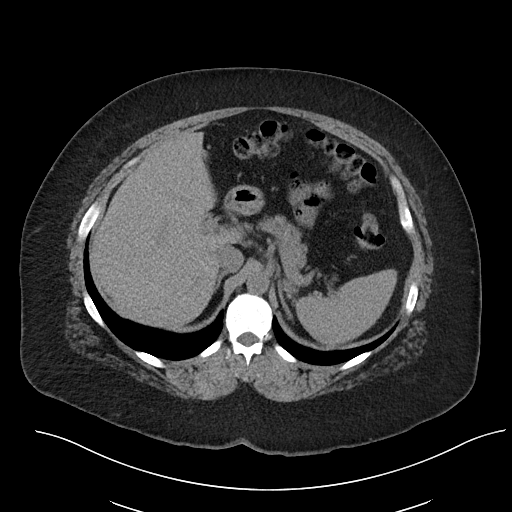
[im 74/95  bone]
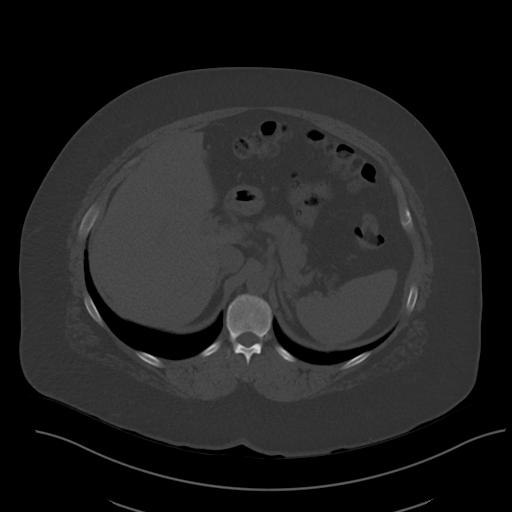
[im 79/95  soft-tissue]
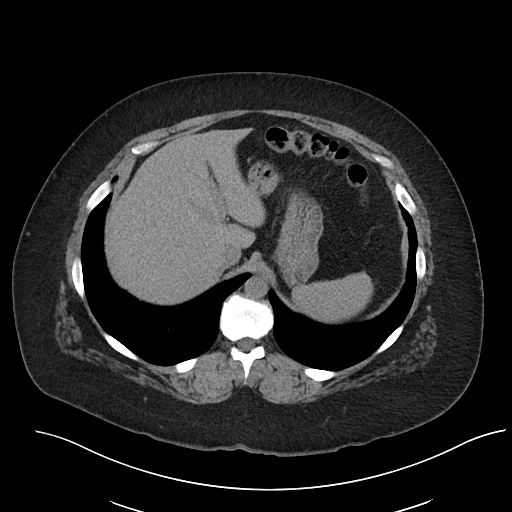
[im 89/95  soft-tissue]
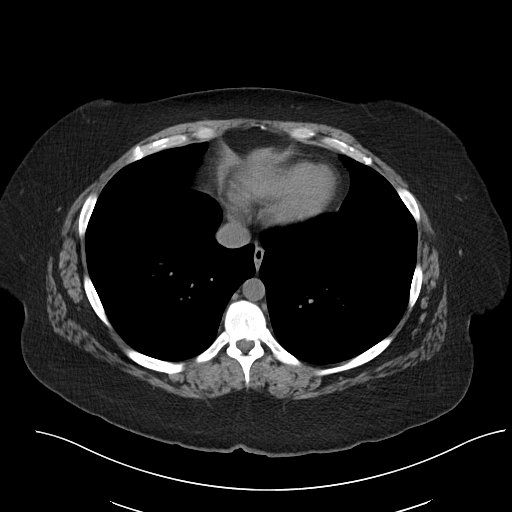

[Series 6: cor · coronal · 0.78mm/px · 3 of 111 slices shown]
[im 37/111  soft-tissue]
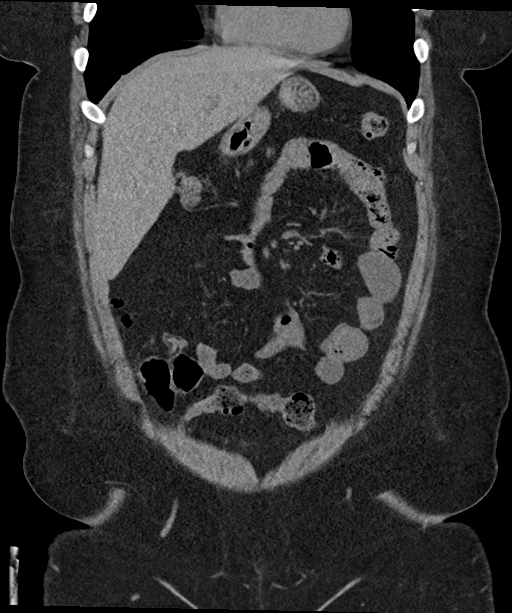
[im 49/111  soft-tissue]
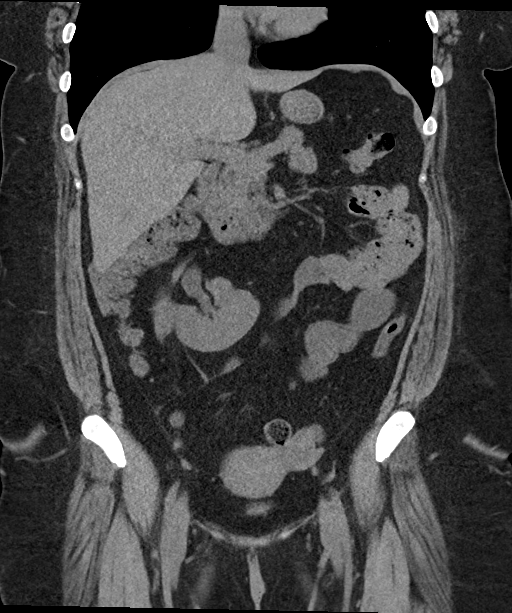
[im 62/111  soft-tissue]
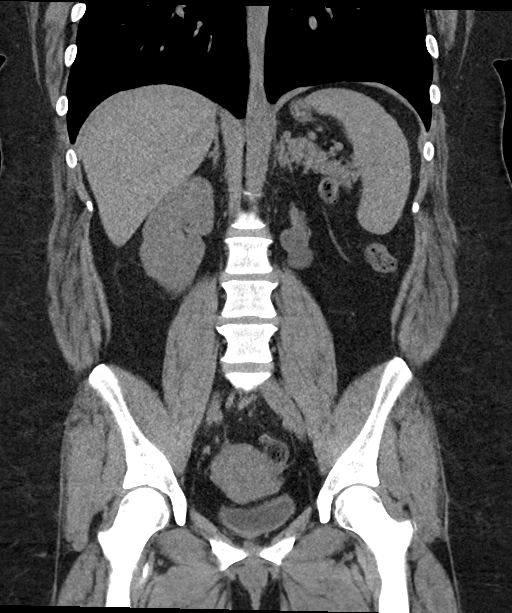

[14 of 46 positions shown; findings below may reference images not displayed]

FINDINGS: Lower chest: Negative.

Hepatobiliary: Surgically absent gallbladder. Negative noncontrast
liver.

Pancreas: Negative.

Spleen: Negative.

Adrenals/Urinary Tract: Normal adrenal glands.

There is an appearance of possible cross fused right side renal
ectopia, or hypertrophied right kidney with duplicated collecting
system. There are at least 2 right side renal arteries identified.
But in the left renal fossa there is a multi cystic lobulated lesion
which is connected by a thin band of tissue to the right side renal
parenchyma. See coronal images 52 through 62. Multiple small cystic
components to almost 3 centimeters range from subcentimeter and have
simple fluid density. There is no associated perinephric stranding
or urinary calculus. A right side ureter takes off anteriorly from
the renal complex and appears decompressed to the bladder. It is
unclear whether there might be a vestigial left side ureter.

The urinary bladder is decompressed and unremarkable.

Stomach/Bowel: Negative rectum aside from retained stool. Redundant
sigmoid colon, but no diverticulosis. Negative descending colon and
transverse colon. Mildly redundant hepatic flexure. Negative right
colon, with no large bowel inflammation identified. Decompressed and
negative terminal ileum.

Normal appendix visible on coronal image 45.

There is an abnormally dilated loop of small bowel in the mid
abdomen containing flocculated stool type material on series 3,
image 52 and coronal images 24 through 30. This loop measures about
40 millimeters diameter with regional smaller but similar
featureless appearing small bowel loops. There is mesenteric
stranding associated with the dominant loop (coronal image 28), but
wall thickening without mesenteric stranding elsewhere (coronal
image 43). There is a gradual transition both upstream and
downstream of these abnormal loops which seem to be distal jejunum.

The distal small bowel appears negative. Decompressed and negative
stomach, duodenum, and proximal jejunum. No free air or free fluid
identified.

Vascular/Lymphatic: Vascular patency is not evaluated in the absence
of IV contrast. No calcified atherosclerosis.

Reproductive: Negative noncontrast appearance.

Other: Trace free fluid in the cul-de-sac. There do appear to be
chronic postoperative changes to the ventral lower abdominal wall,
including somewhat stellate granulation tissue suspected on series
3, image 72. But no regional inflammation.

Musculoskeletal: No acute osseous abnormality identified. Congenital
lower lumbar spinal canal narrowing due to short pedicle distance.
IMPRESSION: 1. The symptomatic abnormality is favored to be abnormal mid
abdominal small bowel which appears mildly inflamed, dilated up to 4
cm, and contains flocculated material.
There is a gradual transition from abnormal mid small bowel loops to
normal proximal and distal small bowel. And no other bowel
inflammation identified.
Differential considerations include acute infectious enteritis,
focal inflammatory bowel disease (although the terminal ileum is
normal), and internal hernia (see #3).

2. Normal appendix. Negative large bowel. No free air or free fluid.

3. Unusual renal malformation which could reflect cross-fused right
side renal ectopia and/or atrophy of the left renal component of a
horseshoe kidney with compensatory hypertrophy on the right.
Small multiloculated benign-appearing cystic lesions occupy the left
renal fossa and appear connected by a thin band of tissue to the
right side renal complex.
No urinary calculus, obstructive uropathy, or inflammation is
evident.

4. Trace pelvic free fluid, likely physiologic.

## 2020-10-16 ENCOUNTER — Ambulatory Visit: Payer: Self-pay | Admitting: General Surgery

## 2020-10-16 NOTE — H&P (Signed)
The patient is a 40 year old female who presents with a complaint of Fistula. 40 year old female who presents to the office for evaluation of recurrent perirectal abscesses. She noticed an abscess approximately one year ago, which ruptured on its own. This initially healed up and then approximately 2 weeks ago, she developed another episode of purulent drainage with straining. She is here for further evaluation for possible anal fistula.   Past Surgical History Carrolyn Leigh, [place]; 10-27-2020 10:57 AM) Cesarean Section - 1 Gallbladder Surgery - Laparoscopic  Diagnostic Studies History Carrolyn Leigh, [place]; 27-Oct-2020 10:57 AM) Colonoscopy never Mammogram never Pap Smear >5 years ago  Allergies Carrolyn Leigh, CMA; 27-Oct-2020 10:58 AM) penicillAMINE *CHEMICALS* Penicillin G Potassium *PENICILLINS* Anaphylaxis.  Social History Carrolyn Leigh, [place]; 27-Oct-2020 10:57 AM) Alcohol use Remotely quit alcohol use. Caffeine use Carbonated beverages, Coffee, Tea. No drug use Tobacco use Former smoker.  Family History Carrolyn Leigh, [place]; 10-27-2020 10:57 AM) Alcohol Abuse Mother. Diabetes Mellitus Father. Heart disease in female family member before age 55 Hypertension Mother.  Pregnancy / Birth History Carrolyn Leigh, [place]; 10/27/2020 10:57 AM) Age at menarche 37 years. Gravida 1 Maternal age 5-25 Para 1 Regular periods  Other Problems Carrolyn Leigh, [place]; 10/27/20 10:57 AM) Anxiety Disorder Gastroesophageal Reflux Disease Thyroid Disease     Review of Systems Arville Go Fox CMA; 10/27/2020 10:57 AM) General Not Present- Appetite Loss, Chills, Fatigue, Fever, Night Sweats, Weight Gain and Weight Loss. Skin Not Present- Change in Wart/Mole, Dryness, Hives, Jaundice, New Lesions, Non-Healing Wounds, Rash and Ulcer. HEENT Present- Seasonal Allergies. Not Present- Earache, Hearing Loss, Hoarseness, Nose Bleed, Oral Ulcers, Ringing in the Ears, Sinus Pain, Sore Throat, Visual Disturbances, Wears  glasses/contact lenses and Yellow Eyes. Respiratory Not Present- Bloody sputum, Chronic Cough, Difficulty Breathing, Snoring and Wheezing. Breast Not Present- Breast Mass, Breast Pain, Nipple Discharge and Skin Changes. Cardiovascular Not Present- Chest Pain, Difficulty Breathing Lying Down, Leg Cramps, Palpitations, Rapid Heart Rate, Shortness of Breath and Swelling of Extremities. Gastrointestinal Present- Rectal Pain. Not Present- Abdominal Pain, Bloating, Bloody Stool, Change in Bowel Habits, Chronic diarrhea, Constipation, Difficulty Swallowing, Excessive gas, Gets full quickly at meals, Hemorrhoids, Indigestion, Nausea and Vomiting. Female Genitourinary Not Present- Frequency, Nocturia, Painful Urination, Pelvic Pain and Urgency. Musculoskeletal Not Present- Back Pain, Joint Pain, Joint Stiffness, Muscle Pain, Muscle Weakness and Swelling of Extremities. Neurological Not Present- Decreased Memory, Fainting, Headaches, Numbness, Seizures, Tingling, Tremor, Trouble walking and Weakness. Psychiatric Present- Anxiety. Not Present- Bipolar, Change in Sleep Pattern, Depression, Fearful and Frequent crying. Endocrine Not Present- Cold Intolerance, Excessive Hunger, Hair Changes, Heat Intolerance, Hot flashes and New Diabetes. Hematology Not Present- Blood Thinners, Easy Bruising, Excessive bleeding, Gland problems, HIV and Persistent Infections.  Vitals Arville Go Fox CMA; 10/27/2020 10:57 AM) 10-27-2020 10:57 AM Weight: 218.13 lb Height: 61in Body Surface Area: 1.96 m Body Mass Index: 41.21 kg/m  Temp.: 98.63F  Pulse: 10097 (Regular)  P.OX: 97% (Room air) BP: 120/80(Sitting, Left Arm, Standard)        Physical Exam Leighton Ruff MD; 06/15/9676 11:18 AM)  General Mental Status-Alert. General Appearance-Cooperative.  Rectal Anorectal Exam External - anorectal fistula(post midline).    Assessment & Plan Leighton Ruff MD; 02/10/8100 11:18 AM)  ANAL FISTULA  (K60.3) Impression: 40 year old female who presents to the office with an anal fistula. She has noticed recurrent anal drainage for several days now. On exam, she has a posterior midline external opening which drains purulence. We discussed exam under anesthesia with fistulotomy versus seton placement. I do not see any  reason that she can't return to work. I don't see any reason that she needs to continue her antibiotics, either. We have discussed the risk and benefits associated with fistulotomy, including the risk of incontinence. We have also discussed the need for additional surgery of the seton is placed. All questions were answered.

## 2020-12-28 ENCOUNTER — Encounter (HOSPITAL_BASED_OUTPATIENT_CLINIC_OR_DEPARTMENT_OTHER): Admission: RE | Payer: Self-pay | Source: Home / Self Care

## 2020-12-28 ENCOUNTER — Ambulatory Visit (HOSPITAL_BASED_OUTPATIENT_CLINIC_OR_DEPARTMENT_OTHER): Admission: RE | Admit: 2020-12-28 | Payer: BC Managed Care – PPO | Source: Home / Self Care | Admitting: General Surgery

## 2020-12-28 SURGERY — EXAM UNDER ANESTHESIA
Anesthesia: Monitor Anesthesia Care

## 2021-03-17 ENCOUNTER — Other Ambulatory Visit: Payer: Self-pay

## 2021-03-17 ENCOUNTER — Emergency Department (HOSPITAL_BASED_OUTPATIENT_CLINIC_OR_DEPARTMENT_OTHER)
Admission: EM | Admit: 2021-03-17 | Discharge: 2021-03-17 | Disposition: A | Discharge status: Home / Self Care | Payer: BC Managed Care – PPO | Attending: Emergency Medicine | Admitting: Emergency Medicine

## 2021-03-17 DIAGNOSIS — K0889 Other specified disorders of teeth and supporting structures: Secondary | ICD-10-CM | POA: Insufficient documentation

## 2021-03-17 MED ORDER — CLINDAMYCIN HCL 300 MG PO CAPS: 300.0000 mg | ORAL_CAPSULE | Freq: Four times a day (QID) | ORAL | 0 refills | Status: DC

## 2021-03-17 MED ORDER — AZITHROMYCIN 250 MG PO TABS: ORAL_TABLET | ORAL | 0 refills | Status: DC

## 2021-03-17 MED ORDER — AZITHROMYCIN 250 MG PO TABS
500.0000 mg | ORAL_TABLET | Freq: Once | ORAL | Status: AC
  Administered 2021-03-17: 500 mg via ORAL
  Filled 2021-03-17: qty 2

## 2021-03-17 NOTE — ED Triage Notes (Signed)
Pt to ED from home with c/o tooth ache and dental swelling on right side of face. Pt has had tooth ache from shattered crown and has appointment in 2 weeks.

## 2021-03-17 NOTE — ED Notes (Signed)
Pt verbalizes understanding of discharge instructions. Opportunity for questioning and answers were provided. Armand removed by staff, pt discharged from ED to home. Educated to pick up Rx.  

## 2021-03-17 NOTE — ED Provider Notes (Signed)
[facility] Provider Note   CSN: [id number] Arrival date & time: 03/17/21  0200     History Chief Complaint  Patient presents with   Dental Pain    Sharon Pruitt is a 40 y.o. female.   Dental Pain Location:  Upper Quality:  Aching and throbbing Severity:  Mild Onset quality:  Gradual Timing:  Constant Progression:  Worsening Chronicity:  Recurrent Context: dental caries and poor dentition   Context: not abscess, not crown fracture, not dental fracture, filling intact, not malocclusion and not recent dental surgery       No past medical history on file.  There are no problems to display for this patient.   No past surgical history on file.   OB History   No obstetric history on file.     No family history on file.  Social History   Tobacco Use   Smoking status: Never   Smokeless tobacco: Never  Substance Use Topics   Alcohol use: Yes    Comment: Socially    Drug use: Not Currently    Home Medications Prior to Admission medications   Medication Sig Start Date End Date Taking? Authorizing Provider  azithromycin (ZITHROMAX Z-PAK) 250 MG tablet 2 po day one, then 1 daily x 4 days 03/17/21  Yes Benjimen Kelley, Corene Cornea, MD  clindamycin (CLEOCIN) 300 MG capsule Take 1 capsule (300 mg total) by mouth 4 (four) times daily. X 7 days 03/17/21  Yes Deyvi Bonanno, Corene Cornea, MD  cyclobenzaprine (FLEXERIL) 5 MG tablet Take 1 tablet (5 mg total) by mouth 3 (three) times daily as needed for muscle spasms. DO NOT DRIVE OR DRINK ALCOHOL WHILE TAKING THIS MEDICATION 02/20/20   Volney American, PA-C  diazepam (VALIUM) 5 MG tablet Take 5 mg by mouth daily as needed for anxiety.    [provider]  HYDROcodone-acetaminophen (NORCO/VICODIN) 5-325 MG tablet Take 1 tablet by mouth every 6 (six) hours as needed. 05/28/19   Deliah Boston, PA-C  levothyroxine (SYNTHROID) 25 MCG tablet Take 25 mcg by mouth daily. 04/02/19   [provider]   metroNIDAZOLE (METROGEL) 0.75 % gel Apply 1 application topically 2 (two) times daily. 02/20/20   Volney American, PA-C  omeprazole (PRILOSEC) 20 MG capsule Take 20 mg by mouth daily. 09/01/18   [provider]  ondansetron (ZOFRAN-ODT) 4 MG disintegrating tablet Take 4 mg by mouth every 12 (twelve) hours as needed for nausea. 09/01/18   [provider]    Allergies    Iodinated diagnostic agents, Penicillins, Sulfa antibiotics, and Lactase  Review of Systems   Review of Systems  All other systems reviewed and are negative.  Physical Exam Updated Vital Signs BP 121/88 (BP Location: Right Arm)   Pulse 82   Temp 98.4 F (36.9 C) (Oral)   Resp 17   SpO2 100%   Physical Exam Vitals and nursing note reviewed.  Constitutional:      Appearance: She is well-developed.  HENT:     Head: Normocephalic and atraumatic.     Mouth/Throat:     Mouth: Mucous membranes are moist.     Pharynx: Oropharynx is clear.  Eyes:     Pupils: Pupils are equal, round, and reactive to light.  Cardiovascular:     Rate and Rhythm: Normal rate and regular rhythm.  Pulmonary:     Effort: Pulmonary effort is normal. No respiratory distress.     Breath sounds: No stridor.  Abdominal:     General: Abdomen is  flat. There is no distension.  Musculoskeletal:     Cervical back: Normal range of motion.  Skin:    General: Skin is warm and dry.  Neurological:     General: No focal deficit present.     Mental Status: She is alert.    ED Results / Procedures / Treatments   Labs (all labs ordered are listed, but only abnormal results are displayed) Labs Reviewed - No data to display  EKG None  Radiology No results found.  Procedures Procedures   Medications Ordered in ED Medications  azithromycin (ZITHROMAX) tablet 500 mg (500 mg Oral Given 03/17/21 0425)    ED Course  I have reviewed the triage vital signs and the nursing notes.  Pertinent labs & imaging results that  were available during my care of the patient were reviewed by me and considered in my medical decision making (see chart for details).    MDM Rules/Calculators/A&P                         No obvious abscess but does have some mild erythema on gums c/w gingivitis, will treat with abx. PCN allergy, suggested clindamycin but patient stated she usually uses azithromycin for her dental infections so rx for that given and clinda if that doesn't work. Dental fu PRN.   Final Clinical Impression(s) / ED Diagnoses Final diagnoses:  Pain, dental    Rx / DC Orders ED Discharge Orders          Ordered    azithromycin (ZITHROMAX Z-PAK) 250 MG tablet        03/17/21 0419    clindamycin (CLEOCIN) 300 MG capsule  4 times daily        03/17/21 0419             Karlee Staff, Corene Cornea, MD 03/17/21 [phone]

## 2021-06-16 ENCOUNTER — Encounter (HOSPITAL_BASED_OUTPATIENT_CLINIC_OR_DEPARTMENT_OTHER): Payer: Self-pay | Admitting: *Deleted

## 2021-06-16 ENCOUNTER — Emergency Department (HOSPITAL_BASED_OUTPATIENT_CLINIC_OR_DEPARTMENT_OTHER): Payer: BLUE CROSS/BLUE SHIELD

## 2021-06-16 ENCOUNTER — Other Ambulatory Visit: Payer: Self-pay

## 2021-06-16 ENCOUNTER — Emergency Department (HOSPITAL_BASED_OUTPATIENT_CLINIC_OR_DEPARTMENT_OTHER)
Admission: EM | Admit: 2021-06-16 | Discharge: 2021-06-16 | Disposition: A | Discharge status: Home / Self Care | Payer: BLUE CROSS/BLUE SHIELD | Attending: Emergency Medicine | Admitting: Emergency Medicine

## 2021-06-16 DIAGNOSIS — R1032 Left lower quadrant pain: Secondary | ICD-10-CM | POA: Diagnosis present

## 2021-06-16 DIAGNOSIS — K922 Gastrointestinal hemorrhage, unspecified: Secondary | ICD-10-CM | POA: Insufficient documentation

## 2021-06-16 DIAGNOSIS — K602 Anal fissure, unspecified: Secondary | ICD-10-CM

## 2021-06-16 HISTORY — DX: Disorder of thyroid, unspecified: E07.9

## 2021-06-16 HISTORY — DX: Other seasonal allergic rhinitis: J30.2

## 2021-06-16 HISTORY — DX: Anal fissure, unspecified: K60.2

## 2021-06-16 LAB — CBC WITH DIFFERENTIAL/PLATELET
Abs Immature Granulocytes: 0.02 10*3/uL (ref 0.00–0.07)
Basophils Absolute: 0 10*3/uL (ref 0.0–0.1)
Basophils Relative: 0 %
Eosinophils Absolute: 0.2 10*3/uL (ref 0.0–0.5)
Eosinophils Relative: 2 %
HCT: 39.8 % (ref 36.0–46.0)
Hemoglobin: 13.4 g/dL (ref 12.0–15.0)
Immature Granulocytes: 0 %
Lymphocytes Relative: 30 %
Lymphs Abs: 2.9 10*3/uL (ref 0.7–4.0)
MCH: 31.2 pg (ref 26.0–34.0)
MCHC: 33.7 g/dL (ref 30.0–36.0)
MCV: 92.8 fL (ref 80.0–100.0)
Monocytes Absolute: 0.7 10*3/uL (ref 0.1–1.0)
Monocytes Relative: 7 %
Neutro Abs: 6 10*3/uL (ref 1.7–7.7)
Neutrophils Relative %: 61 %
Platelets: 340 10*3/uL (ref 150–400)
RBC: 4.29 MIL/uL (ref 3.87–5.11)
RDW: 12.3 % (ref 11.5–15.5)
WBC: 9.9 10*3/uL (ref 4.0–10.5)
nRBC: 0 % (ref 0.0–0.2)

## 2021-06-16 LAB — COMPREHENSIVE METABOLIC PANEL
ALT: 25 U/L (ref 0–44)
AST: 16 U/L (ref 15–41)
Albumin: 4 g/dL (ref 3.5–5.0)
Alkaline Phosphatase: 79 U/L (ref 38–126)
Anion gap: 8 (ref 5–15)
BUN: 16 mg/dL (ref 6–20)
CO2: 25 mmol/L (ref 22–32)
Calcium: 8.7 mg/dL — ABNORMAL LOW (ref 8.9–10.3)
Chloride: 106 mmol/L (ref 98–111)
Creatinine, Ser: 0.91 mg/dL (ref 0.44–1.00)
GFR, Estimated: >60 mL/min (ref >60)
Glucose, Bld: 92 mg/dL (ref 70–99)
Potassium: 3.5 mmol/L (ref 3.5–5.1)
Sodium: 139 mmol/L (ref 135–145)
Total Bilirubin: 0.3 mg/dL (ref 0.3–1.2)
Total Protein: 7.3 g/dL (ref 6.5–8.1)

## 2021-06-16 LAB — URINALYSIS, ROUTINE W REFLEX MICROSCOPIC
Bilirubin Urine: NEGATIVE
Glucose, UA: NEGATIVE mg/dL
Hgb urine dipstick: NEGATIVE
Ketones, ur: NEGATIVE mg/dL
Nitrite: NEGATIVE
Protein, ur: NEGATIVE mg/dL
Specific Gravity, Urine: 1.02 (ref 1.005–1.030)
pH: 5.5 (ref 5.0–8.0)

## 2021-06-16 LAB — OCCULT BLOOD X 1 CARD TO LAB, STOOL: Fecal Occult Bld: NEGATIVE

## 2021-06-16 LAB — PREGNANCY, URINE: Preg Test, Ur: NEGATIVE

## 2021-06-16 NOTE — ED Notes (Signed)
Lab phoned for the right order to be placed in Epic for occult blood, which I did.

## 2021-06-16 NOTE — ED Triage Notes (Addendum)
Here from work. C/o abd pain, fatigue, NVD, bloody stool, felt a pulling sensation in abd when lifting child. Pinpoints pain to LLQ. Also mentions h/o rectal fissure and recent constipation. Onset 4d ago.

## 2021-06-16 NOTE — ED Provider Notes (Addendum)
[facility] DEPT Provider Note   CSN: [id number] Arrival date & time: 06/16/21  1056     History  Chief Complaint  Patient presents with   Rectal Bleeding    Esperanza Madrazo is a 41 y.o. female.  Patient is a 41 yo female presenting for rectal bleeding. Patient admits to abdominal pain after lifting adult son with associated nausea/vomiting/diarrhea. Abdominal pain is in LLQ, non radiating, x 4 days. Admits to new bloody in stool seen today. Hx of anal fissure. Admits to rectal pain. Denies previous hx of GI bleed.  Past surgical hx: cholecystectomy No hx of IBS, UC, or colon cancer. PMH: anal fissure  The history is provided by the patient. No language interpreter was used.  Rectal Bleeding Associated symptoms: abdominal pain and vomiting   Associated symptoms: no fever       Home Medications Prior to Admission medications   Medication Sig Start Date End Date Taking? Authorizing Provider  azithromycin (ZITHROMAX Z-PAK) 250 MG tablet 2 po day one, then 1 daily x 4 days 03/17/21   Mesner, Corene Cornea, MD  clindamycin (CLEOCIN) 300 MG capsule Take 1 capsule (300 mg total) by mouth 4 (four) times daily. X 7 days 03/17/21   Mesner, Corene Cornea, MD  cyclobenzaprine (FLEXERIL) 5 MG tablet Take 1 tablet (5 mg total) by mouth 3 (three) times daily as needed for muscle spasms. DO NOT DRIVE OR DRINK ALCOHOL WHILE TAKING THIS MEDICATION 02/20/20   Volney American, PA-C  diazepam (VALIUM) 5 MG tablet Take 5 mg by mouth daily as needed for anxiety.    [provider]  HYDROcodone-acetaminophen (NORCO/VICODIN) 5-325 MG tablet Take 1 tablet by mouth every 6 (six) hours as needed. 05/28/19   Deliah Boston, PA-C  levothyroxine (SYNTHROID) 25 MCG tablet Take 25 mcg by mouth daily. 04/02/19   [provider]  metroNIDAZOLE (METROGEL) 0.75 % gel Apply 1 application topically 2 (two) times daily. 02/20/20   Volney American, PA-C  omeprazole (PRILOSEC) 20 MG  capsule Take 20 mg by mouth daily. 09/01/18   [provider]  ondansetron (ZOFRAN-ODT) 4 MG disintegrating tablet Take 4 mg by mouth every 12 (twelve) hours as needed for nausea. 09/01/18   [provider]      Allergies    Iodinated contrast media, Penicillins, Sulfa antibiotics, and Tilactase    Review of Systems   Review of Systems  Constitutional:  Negative for chills and fever.  HENT:  Negative for ear pain and sore throat.   Eyes:  Negative for pain and visual disturbance.  Respiratory:  Negative for cough and shortness of breath.   Cardiovascular:  Negative for chest pain and palpitations.  Gastrointestinal:  Positive for abdominal pain, blood in stool, diarrhea, hematochezia, nausea and vomiting.  Genitourinary:  Negative for dysuria and hematuria.  Musculoskeletal:  Negative for arthralgias and back pain.  Skin:  Negative for color change and rash.  Neurological:  Negative for seizures and syncope.  All other systems reviewed and are negative.  Physical Exam Updated Vital Signs BP 123/88    Pulse 80    Temp 98.2 F (36.8 C) (Oral)    Resp 17    Ht 5\' 1"  (1.549 m)    Wt 90.7 kg    LMP 05/11/2021 (Approximate)    SpO2 100%    BMI 37.79 kg/m  Physical Exam Vitals and nursing note reviewed.  Constitutional:      General: She is not in acute distress.  Appearance: She is well-developed.  HENT:     Head: Normocephalic and atraumatic.  Eyes:     Conjunctiva/sclera: Conjunctivae normal.  Cardiovascular:     Rate and Rhythm: Normal rate and regular rhythm.     Heart sounds: No murmur heard. Pulmonary:     Effort: Pulmonary effort is normal. No respiratory distress.     Breath sounds: Normal breath sounds.  Abdominal:     Palpations: Abdomen is soft.     Tenderness: There is abdominal tenderness in the left lower quadrant.  Genitourinary:    Rectum: Tenderness and anal fissure present. No external hemorrhoid or internal hemorrhoid. Normal anal tone.        Comments: No gross blood Musculoskeletal:        General: No swelling.     Cervical back: Neck supple.  Skin:    General: Skin is warm and dry.     Capillary Refill: Capillary refill takes less than 2 seconds.  Neurological:     Mental Status: She is alert.  Psychiatric:        Mood and Affect: Mood normal.    ED Results / Procedures / Treatments   Labs (all labs ordered are listed, but only abnormal results are displayed) Labs Reviewed  COMPREHENSIVE METABOLIC PANEL - Abnormal; Notable for the following components:      Result Value   Calcium 8.7 (*)    All other components within normal limits  URINALYSIS, ROUTINE W REFLEX MICROSCOPIC - Abnormal; Notable for the following components:   Leukocytes,Ua SMALL (*)    Bacteria, UA RARE (*)    All other components within normal limits  PREGNANCY, URINE  CBC WITH DIFFERENTIAL/PLATELET  POC OCCULT BLOOD, ED    EKG None  Radiology No results found.  Procedures Procedures    Medications Ordered in ED Medications - No data to display  ED Course/ Medical Decision Making/ A&P                           Medical Decision Making  2:11 PM 41 yo female presenting for LLQ, nausea, vomiting, dirrhea, and blood in stool. Patient is Aox3, no acute distress, afebrile, with stable vitals. Abdomen is soft with minimal tenderness in LLQ. Rectal exam demonstrates very small non bleeding anal fissure at 12 o'clock position. No gross blood on exam. No hemorrhoids. Hemoccult wnl. Stable labs. Unable to get CT with contrast exam due to contrast allergy. CT non contrast demonstrates no acute process. No diverticulitis.   Patient in no distress and overall condition improved here in the ED. Detailed discussions were had with the patient regarding current findings, and need for close f/u with gastroenterologist. The patient has been instructed to return immediately if the symptoms worsen in any way for re-evaluation. Patient verbalized  understanding and is in agreement with current care plan. All questions answered prior to discharge.         Final Clinical Impression(s) / ED Diagnoses Final diagnoses:  Gastrointestinal hemorrhage, unspecified gastrointestinal hemorrhage type  Anal fissure    Rx / DC Orders ED Discharge Orders     None         Lianne Cure, DO 89/37/34 [id number]    Xadrian Craighead, [place], DO 81/15/72 [phone]

## 2021-06-17 ENCOUNTER — Encounter (HOSPITAL_BASED_OUTPATIENT_CLINIC_OR_DEPARTMENT_OTHER): Payer: Self-pay | Admitting: Emergency Medicine

## 2022-08-26 IMAGING — CT CT ABD-PELV W/O CM
2 of 4 series · 15 of 46 positions shown, 17 images · non-contrast
Comparison: CT examination dated May 28, 2019
COMPARISON: CT examination dated May 28, 2019

Addendum:
CLINICAL DATA: Abdominal pain, left lower quadrant pain.

EXAM:
CT ABDOMEN AND PELVIS WITHOUT CONTRAST
TECHNIQUE: Multidetector CT imaging of the abdomen and pelvis was performed
following the standard protocol without IV contrast.

[Series 2: abd pel wo · axial · 0.84mm/px · z∈[-475,-35]mm · 12 of 97 slices shown, 14 images]
[im 5/97  soft-tissue]
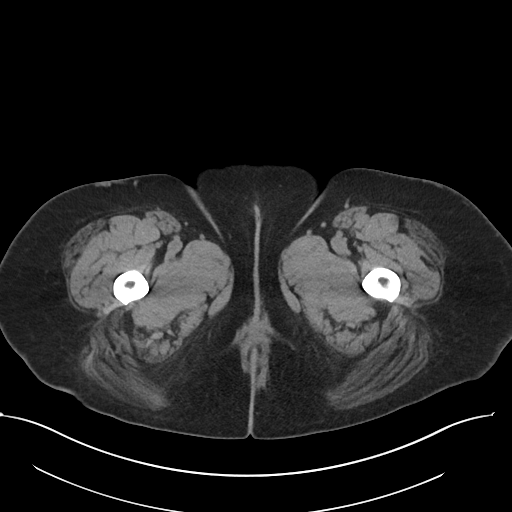
[im 5/97  bone]
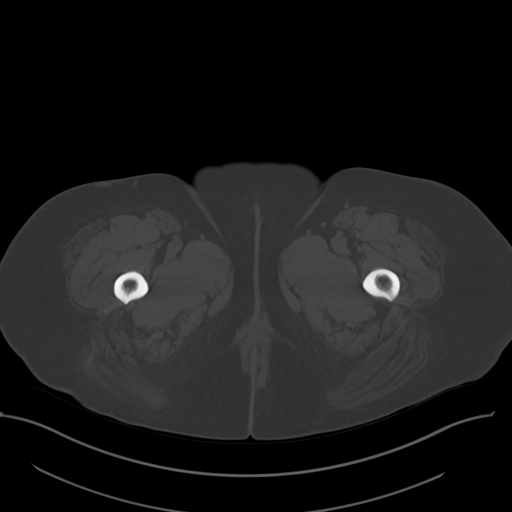
[im 13/97  soft-tissue]
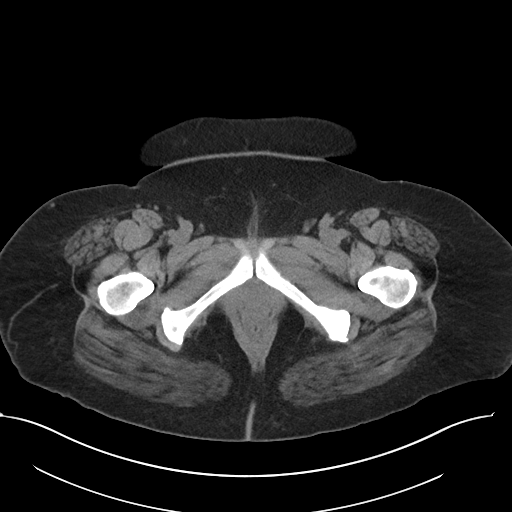
[im 21/97  soft-tissue]
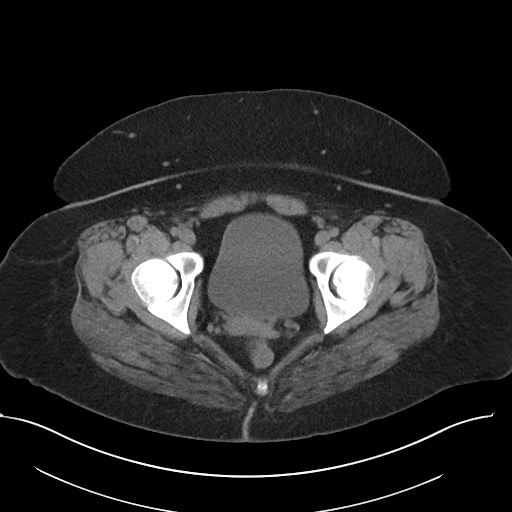
[im 29/97  soft-tissue]
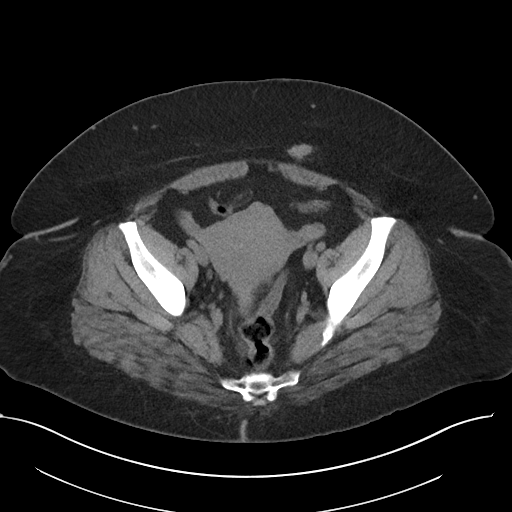
[im 37/97  soft-tissue]
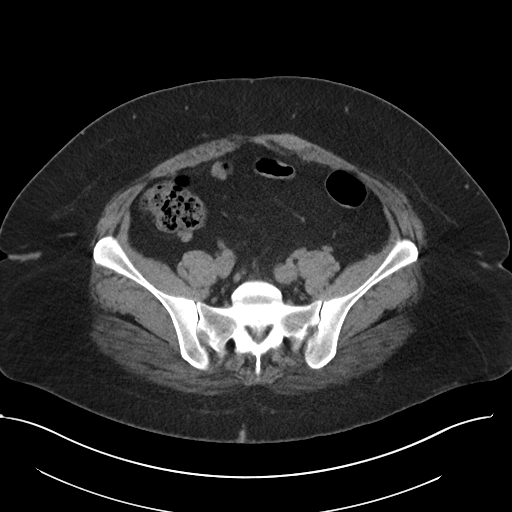
[im 45/97  soft-tissue]
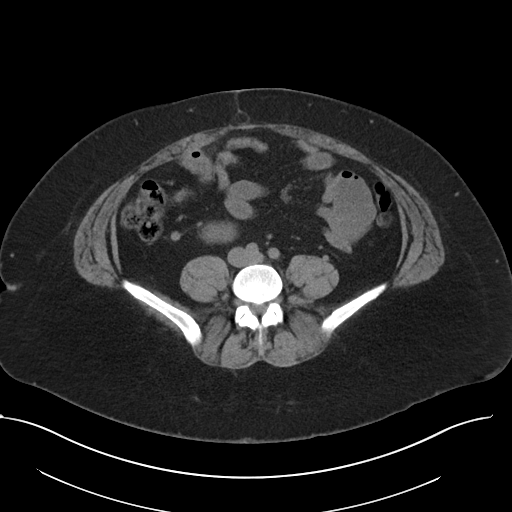
[im 53/97  soft-tissue]
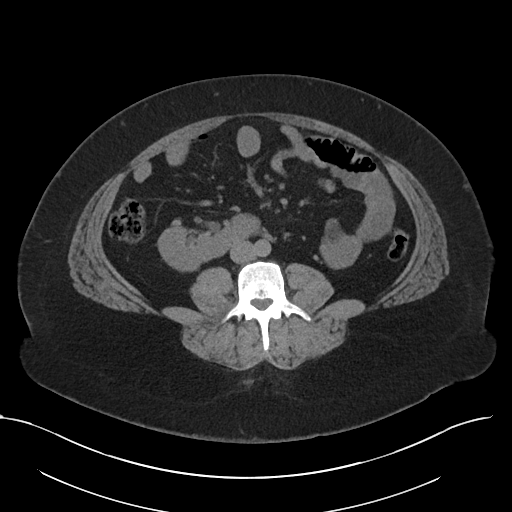
[im 61/97  soft-tissue]
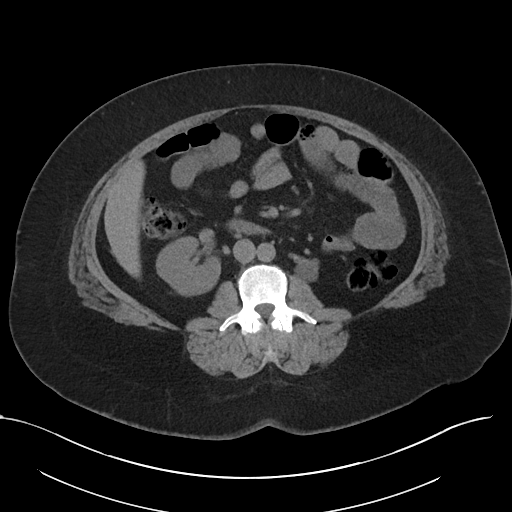
[im 69/97  soft-tissue]
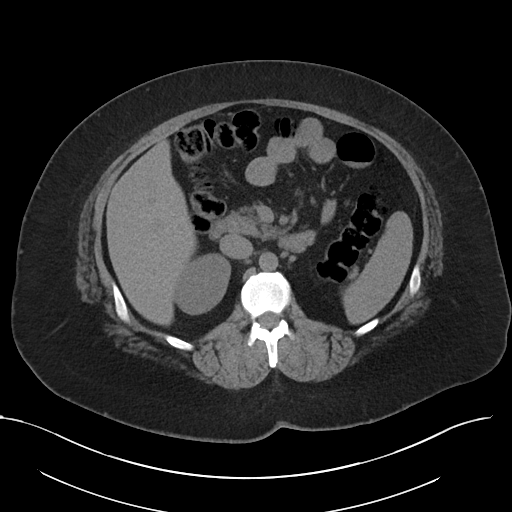
[im 69/97  bone]
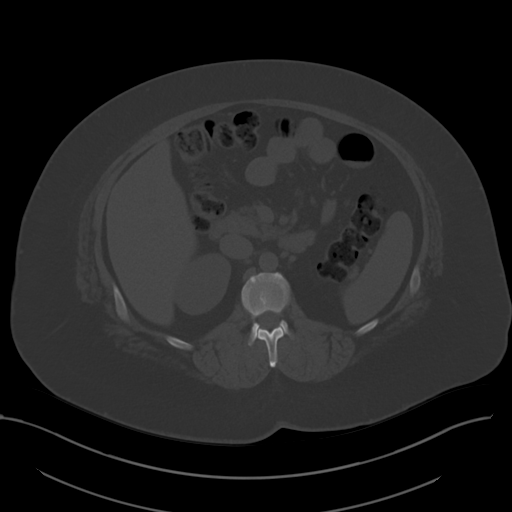
[im 77/97  soft-tissue]
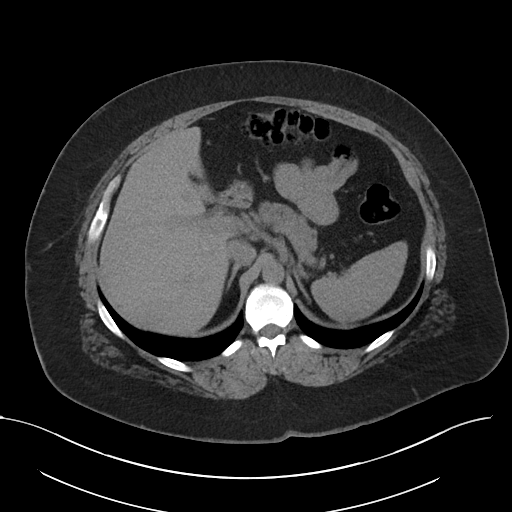
[im 85/97  soft-tissue]
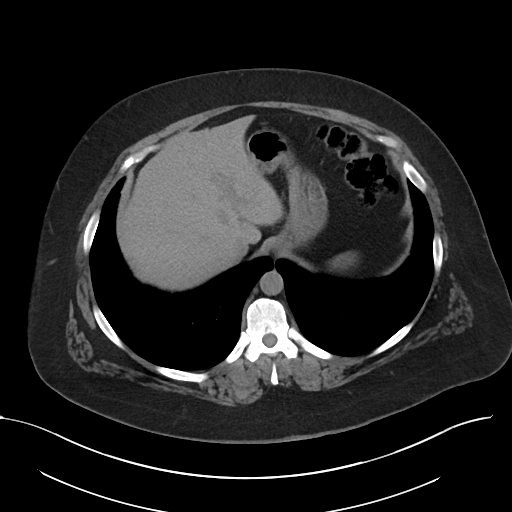
[im 93/97  soft-tissue]
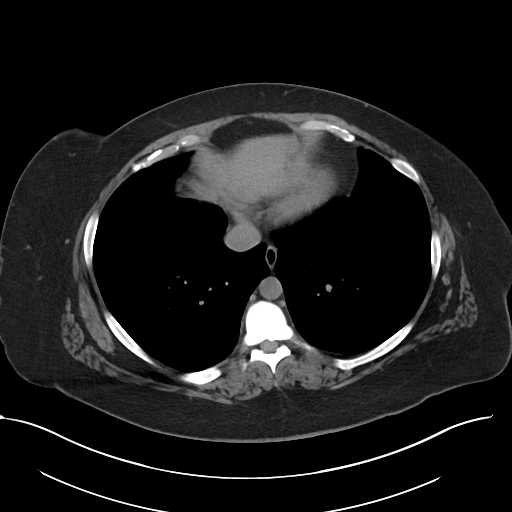

[Series 5: coronal · coronal · 0.93mm/px · 3 of 105 slices shown]
[im 35/105  soft-tissue]
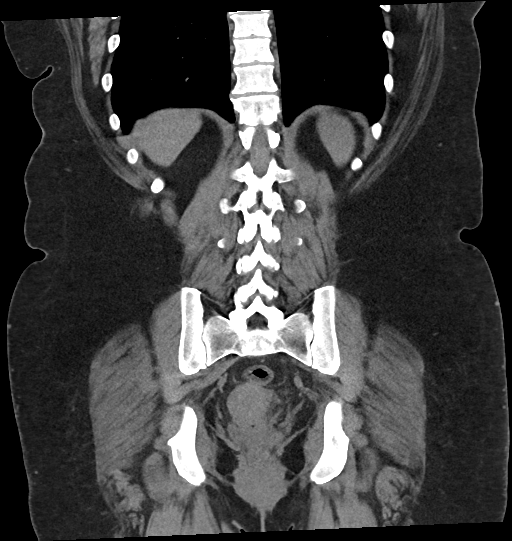
[im 47/105  soft-tissue]
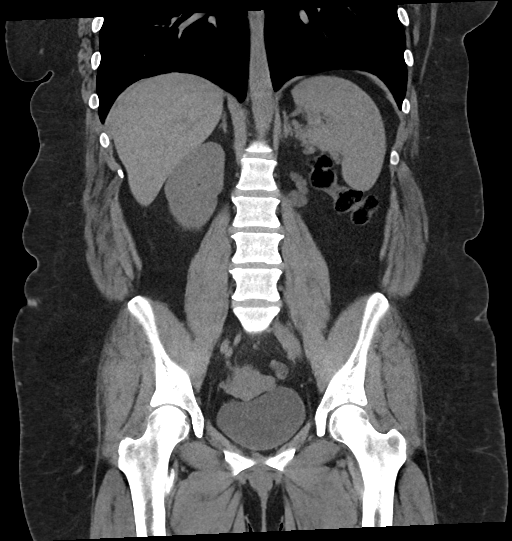
[im 58/105  soft-tissue]
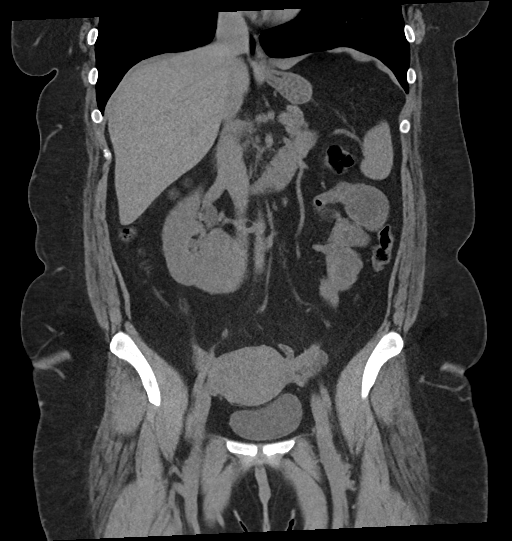

[15 of 46 positions shown; findings below may reference images not displayed]

FINDINGS: Lower chest: No acute abnormality.

Hepatobiliary: No focal liver abnormality is seen. Status post
cholecystectomy. No biliary dilatation.

Pancreas: Unremarkable. No pancreatic ductal dilatation or
surrounding inflammatory changes.

Spleen: Normal in size without focal abnormality.

Adrenals/Urinary Tract: There is likely a crossed fused right-sided
renal ectopia or a duplicated right renal collecting system with
hypertrophied kidney. There is small soft tissue and a cyst in the
expected location of the left kidney. No evidence of hydronephrosis
or ureteral calculus. No perinephric fat stranding. Urinary bladder
is unremarkable.

Stomach/Bowel: Stomach is within normal limits. Appendix appears
normal. No evidence of bowel wall thickening, distention, or
inflammatory changes.

Vascular/Lymphatic: No significant vascular findings are present. No
enlarged abdominal or pelvic lymph nodes.

Reproductive: Uterus and bilateral adnexa are unremarkable.

Other: Small fat containing umbilical hernia. No abdominopelvic
ascites.

Musculoskeletal: No acute or significant osseous findings.
IMPRESSION: 1.  No CT evidence of acute abdominal/pelvic process.

2. Renal malformation, which may represent crossed fused ectopia or
duplicated right renal collecting system with hypertrophy. No
evidence of nephrolithiasis or hydronephrosis. No perinephric fat
stranding.

3.  Normal appendix.  No evidence of colitis or diverticulitis.

ADDENDUM:
Cystic nodules in the left renal fossa measuring approximately 1.8 x
1.7 cm and smaller nodule measuring 1.5 x 1.6 cm, unchanged in size
since prior examination May 28, 2019. These likely represent
simple nodule without solid component in are unchanged since 1919.
No further follow-up is recommended.

No adrenal nodule.

*** End of Addendum ***
FINDINGS: Lower chest: No acute abnormality.

Hepatobiliary: No focal liver abnormality is seen. Status post
cholecystectomy. No biliary dilatation.

Pancreas: Unremarkable. No pancreatic ductal dilatation or
surrounding inflammatory changes.

Spleen: Normal in size without focal abnormality.

Adrenals/Urinary Tract: There is likely a crossed fused right-sided
renal ectopia or a duplicated right renal collecting system with
hypertrophied kidney. There is small soft tissue and a cyst in the
expected location of the left kidney. No evidence of hydronephrosis
or ureteral calculus. No perinephric fat stranding. Urinary bladder
is unremarkable.

Stomach/Bowel: Stomach is within normal limits. Appendix appears
normal. No evidence of bowel wall thickening, distention, or
inflammatory changes.

Vascular/Lymphatic: No significant vascular findings are present. No
enlarged abdominal or pelvic lymph nodes.

Reproductive: Uterus and bilateral adnexa are unremarkable.

Other: Small fat containing umbilical hernia. No abdominopelvic
ascites.

Musculoskeletal: No acute or significant osseous findings.
IMPRESSION: 1.  No CT evidence of acute abdominal/pelvic process.

2. Renal malformation, which may represent crossed fused ectopia or
duplicated right renal collecting system with hypertrophy. No
evidence of nephrolithiasis or hydronephrosis. No perinephric fat
stranding.

3.  Normal appendix.  No evidence of colitis or diverticulitis.

## 2023-03-09 ENCOUNTER — Encounter (HOSPITAL_COMMUNITY): Payer: Self-pay | Admitting: Emergency Medicine

## 2023-03-09 ENCOUNTER — Ambulatory Visit (HOSPITAL_COMMUNITY)
Admission: EM | Admit: 2023-03-09 | Discharge: 2023-03-09 | Disposition: A | Discharge status: Home / Self Care | Payer: BC Managed Care – PPO

## 2023-03-09 DIAGNOSIS — K047 Periapical abscess without sinus: Secondary | ICD-10-CM

## 2023-03-09 MED ORDER — CLINDAMYCIN HCL 300 MG PO CAPS: 300.0000 mg | ORAL_CAPSULE | Freq: Three times a day (TID) | ORAL | 0 refills | Status: AC

## 2023-03-09 MED ORDER — KETOROLAC TROMETHAMINE 10 MG PO TABS: 10.0000 mg | ORAL_TABLET | Freq: Three times a day (TID) | ORAL | 0 refills | Status: AC | PRN

## 2023-03-09 MED ORDER — OMEPRAZOLE 20 MG PO CPDR
20.0000 mg | DELAYED_RELEASE_CAPSULE | Freq: Every day | ORAL | 0 refills | Status: AC
Start: 2023-03-09 — End: ?

## 2023-03-09 NOTE — ED Triage Notes (Signed)
Pt having left dental pain and swelling since Tuesday. Dentist called in Z-pack and pain medications. Pt wants to know if needs a different antibiotics. Been taking Ibuprofen.

## 2023-03-09 NOTE — Discharge Instructions (Addendum)
You were seen today for dental pain.  We are treating you for a abscessed tooth.  I have sent in antibiotics for you to take 3 times daily for the next 7 days.  I have also sent in Toradol which is an anti-inflammatory for you to take every 8 hours for pain and inflammation.  I have also sent in omeprazole which helps coat the stomach since you have been taking so much ibuprofen.  Take this daily for the next 7 days.  You can use salt water gargles to help disinfect the area as well.  Please follow-up with your dentist on 10/1 as previously scheduled.

## 2023-03-09 NOTE — ED Provider Notes (Signed)
[facility]    CSN: [id number] Arrival date & time: 03/09/23  1001      History   Chief Complaint Chief Complaint  Patient presents with   Dental Pain    HPI Sharon Pruitt is a 42 y.o. female with a history of hypothyroidism presents to urgent care today with complaint of left lower dental pain.  She reports this started 6 days ago.  She describes the pain as throbbing.  She has had difficulty chewing.  She did call her dentist to called in azithromycin and hydrocodone but she reports this has not helped.  She feels like the left side of her face is swollen.  She is running a low-grade fever but denies chills or bodyaches.  She has been taking ibuprofen OTC with some relief of symptoms.  She reports she has an appointment with her dentist 10/1.  HPI  Past Medical History:  Diagnosis Date   Rectal fissure    Seasonal allergies    Thyroid disease     There are no problems to display for this patient.   Past Surgical History:  Procedure Laterality Date   CHOLECYSTECTOMY      OB History   No obstetric history on file.      Home Medications    Prior to Admission medications   Medication Sig Start Date End Date Taking? Authorizing Provider  ketorolac (TORADOL) 10 MG tablet Take 1 tablet (10 mg total) by mouth every 8 (eight) hours as needed. 03/09/23  Yes Lorre Munroe, NP  loratadine (CLARITIN) 10 MG tablet Take 10 mg by mouth daily. 02/21/18  Yes [provider]  clindamycin (CLEOCIN) 300 MG capsule Take 1 capsule (300 mg total) by mouth 3 (three) times daily. X 7 days 03/09/23   Lorre Munroe, NP  diazepam (VALIUM) 5 MG tablet Take 5 mg by mouth daily as needed for anxiety.    [provider]  levothyroxine (SYNTHROID) 25 MCG tablet Take 25 mcg by mouth daily. 04/02/19   [provider]  metroNIDAZOLE (METROGEL) 0.75 % gel Apply 1 application topically 2 (two) times daily. 02/20/20   Particia Nearing, PA-C  omeprazole  (PRILOSEC) 20 MG capsule Take 1 capsule (20 mg total) by mouth daily. 03/09/23   Lorre Munroe, NP    Family History No family history on file.  Social History Social History   Tobacco Use   Smoking status: Never   Smokeless tobacco: Never  Substance Use Topics   Alcohol use: Yes    Comment: Socially    Drug use: Not Currently     Allergies   Iodinated contrast media, Penicillins, Sulfa antibiotics, and Tilactase   Review of Systems Review of Systems  Constitutional:  Positive for fever. Negative for chills.  HENT:  Positive for dental problem and facial swelling. Negative for trouble swallowing.   Respiratory:  Negative for cough and shortness of breath.   Cardiovascular:  Negative for chest pain.  Musculoskeletal:  Negative for arthralgias and myalgias.  Neurological:  Negative for weakness and headaches.     Physical Exam Triage Vital Signs ED Triage Vitals  Encounter Vitals Group     BP 03/09/23 1022 (!) 124/90     Systolic BP Percentile --      Diastolic BP Percentile --      Pulse Rate 03/09/23 1022 93     Resp 03/09/23 1022 16     Temp 03/09/23 1022 99.4 F (37.4 C)  Temp Source 03/09/23 1022 Oral     SpO2 03/09/23 1022 98 %     Weight --      Height --      Head Circumference --      Peak Flow --      Pain Score 03/09/23 1018 5     Pain Loc --      Pain Education --      Exclude from Growth Chart --    No data found.  Updated Vital Signs BP (!) 124/90 (BP Location: Right Arm)   Pulse 93   Temp 99.4 F (37.4 C) (Oral)   Resp 16   LMP 02/05/2023   SpO2 98%      Physical Exam Constitutional:      General: She is not in acute distress.    Appearance: She is obese.  HENT:     Head: Normocephalic.     Mouth/Throat:     Mouth: Mucous membranes are dry.     Dentition: Abnormal dentition. Dental tenderness, gingival swelling, dental caries and dental abscesses present.     Comments: Tooth #19 is seeing.  Tooth #18 with dental decay.   Tooth #17 broken at the gumline, necrosed with gingival swelling and tenderness with palpation. Cardiovascular:     Rate and Rhythm: Normal rate and regular rhythm.  Pulmonary:     Effort: Pulmonary effort is normal.     Breath sounds: Normal breath sounds.  Skin:    General: Skin is warm and dry.     Findings: No rash.  Neurological:     General: No focal deficit present.     Mental Status: She is alert and oriented to person, place, and time.      UC Treatments / Results  Labs   EKG   Radiology No results found.  Procedures Procedures (including critical care time)  Medications Ordered in UC Medications - No data to display  Initial Impression / Assessment and Plan / UC Course  I have reviewed the triage vital signs and the nursing notes.  Pertinent labs & imaging results that were available during my care of the patient were reviewed by me and considered in my medical decision making (see chart for details).     42 year old female with 6-day history of dental pain, swelling, tenderness and low-grade fever despite taking azithromycin and hydrocodone prescribed by her PCP.  Encouraged salt water gargles.  Rx for clindamycin 300 mg 3 times daily for 7 days.  Rx for Toradol 10 mg every 8 hours as needed for pain and inflammation.  Advised her that she may want to consider taking a Prilosec 20 mg OTC daily for the next 7 days given the amount of ibuprofen she has been taking OTC.  She will follow-up with her dentist on 10/1 as scheduled for further evaluation and treatment.  Final Clinical Impressions(s) / UC Diagnoses   Final diagnoses:  Dental abscess     Discharge Instructions      You were seen today for dental pain.  We are treating you for a abscessed tooth.  I have sent in antibiotics for you to take 3 times daily for the next 7 days.  I have also sent in Toradol which is an anti-inflammatory for you to take every 8 hours for pain and inflammation.  I have also  sent in omeprazole which helps coat the stomach since you have been taking so much ibuprofen.  Take this daily for the next 7 days.  You can use salt water gargles to help disinfect the area as well.  Please follow-up with your dentist on 10/1 as previously scheduled.     ED Prescriptions     Medication Sig Dispense Auth. Provider   clindamycin (CLEOCIN) 300 MG capsule Take 1 capsule (300 mg total) by mouth 3 (three) times daily. X 7 days 21 capsule Lorre Munroe, NP   omeprazole (PRILOSEC) 20 MG capsule Take 1 capsule (20 mg total) by mouth daily. 7 capsule Lorre Munroe, NP   ketorolac (TORADOL) 10 MG tablet Take 1 tablet (10 mg total) by mouth every 8 (eight) hours as needed. 15 tablet Lorre Munroe, NP      PDMP not reviewed this encounter.   Lorre Munroe, NP 03/09/23 1039

## 2023-03-13 ENCOUNTER — Other Ambulatory Visit: Payer: Self-pay | Admitting: Internal Medicine

## 2023-03-13 NOTE — Telephone Encounter (Signed)
Requested by interface surescripts. Not under prescriber care. Requested Prescriptions  Refused Prescriptions Disp Refills   omeprazole (PRILOSEC) 20 MG capsule [Pharmacy Med Name: OMEPRAZOLE 20MG  CAPSULES] 7 capsule 0    Sig: TAKE 1 CAPSULE(20 MG) BY MOUTH DAILY     Gastroenterology: Proton Pump Inhibitors Failed - 03/13/2023  3:11 AM      Failed - Valid encounter within last 12 months    Recent Outpatient Visits   None
